# Patient Record
Sex: Male | Born: 1965
Health system: Southern US, Community
[De-identification: ages and names within clinical notes are randomized; demographics above are authoritative.]

## PROBLEM LIST (undated history)

## (undated) DIAGNOSIS — T7840XA Allergy, unspecified, initial encounter: Secondary | ICD-10-CM

## (undated) DIAGNOSIS — I499 Cardiac arrhythmia, unspecified: Secondary | ICD-10-CM

## (undated) DIAGNOSIS — E785 Hyperlipidemia, unspecified: Secondary | ICD-10-CM

## (undated) HISTORY — PX: SKIN LESION EXCISION: SHX2412

## (undated) HISTORY — DX: Cardiac arrhythmia, unspecified: I49.9

## (undated) HISTORY — DX: Allergy, unspecified, initial encounter: T78.40XA

## (undated) HISTORY — DX: Hyperlipidemia, unspecified: E78.5

---

## 1984-02-11 HISTORY — PX: KNEE ARTHROSCOPY: SUR90

## 2003-10-06 ENCOUNTER — Ambulatory Visit: Payer: Self-pay | Admitting: *Deleted

## 2006-02-10 HISTORY — PX: TONGUE BIOPSY: SHX1075

## 2014-06-16 ENCOUNTER — Ambulatory Visit: Payer: Self-pay | Admitting: Cardiology

## 2014-07-14 ENCOUNTER — Encounter: Payer: Self-pay | Admitting: *Deleted

## 2014-07-17 ENCOUNTER — Ambulatory Visit (INDEPENDENT_AMBULATORY_CARE_PROVIDER_SITE_OTHER): Payer: BLUE CROSS/BLUE SHIELD | Admitting: Cardiology

## 2014-07-17 ENCOUNTER — Encounter: Payer: Self-pay | Admitting: Cardiology

## 2014-07-17 ENCOUNTER — Other Ambulatory Visit: Payer: Self-pay

## 2014-07-17 ENCOUNTER — Ambulatory Visit (INDEPENDENT_AMBULATORY_CARE_PROVIDER_SITE_OTHER)
Admission: RE | Admit: 2014-07-17 | Discharge: 2014-07-17 | Disposition: A | Discharge status: Home / Self Care | Payer: BLUE CROSS/BLUE SHIELD | Source: Ambulatory Visit | Attending: Cardiology | Admitting: Cardiology

## 2014-07-17 VITALS — BP 126/82 | HR 60 | Ht 72.0 in

## 2014-07-17 DIAGNOSIS — R002 Palpitations: Secondary | ICD-10-CM

## 2014-07-17 DIAGNOSIS — E785 Hyperlipidemia, unspecified: Secondary | ICD-10-CM | POA: Diagnosis not present

## 2014-07-17 DIAGNOSIS — Z8249 Family history of ischemic heart disease and other diseases of the circulatory system: Secondary | ICD-10-CM | POA: Diagnosis not present

## 2014-07-17 NOTE — Patient Instructions (Addendum)
Medication Instructions:  Your physician recommends that you continue on your current medications as directed. Please refer to the Current Medication list given to you today.  Labwork: NONE  Testing/Procedures: Your physician has requested that you have cardiac CT. Cardiac computed tomography (CT) is a painless test that uses an x-ray machine to take clear, detailed pictures of your heart. For further information please visit [facility]. Please follow instruction sheet as given.  Your physician has recommended that you wear a 24 holter monitor. Holter monitors are medical devices that record the heart's electrical activity. Doctors most often use these monitors to diagnose arrhythmias. Arrhythmias are problems with the speed or rhythm of the heartbeat. The monitor is a small, portable device. You can wear one while you do your normal daily activities. This is usually used to diagnose what is causing palpitations/syncope (passing out).  Follow-Up: Your physician recommends that you schedule a follow-up appointment in: 2 months with Dr. Meda Coffee.   Any Other Special Instructions Will Be Listed Below (If Applicable).

## 2014-07-17 NOTE — Addendum Note (Signed)
Addended by: Ewell Poe L on: 07/17/2014 10:56 AM   Modules accepted: Orders

## 2014-07-17 NOTE — Progress Notes (Signed)
Patient ID: Justin Stanley, male   DOB: August 20, 1965, 49 y.o.   MRN: [id number]      Cardiology Office Note   Date:  07/17/2014   ID:  [name], DOB 03/23/1965, MRN [id number]  PCP:  Dayton Martes, MD  Cardiologist:  Dorothy Spark, MD   Chief complain: Palpitations   History of Present Illness: Justin Stanley is a 49 y.o. male who presents for evaluation of palpitations. He was previously followed by Dr Lia Foyer. He had negative stress test and normal echocardiogram about 10 years ago. 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 No LE edema, orthopnea. He is ok during the day but develops palpitations at night after dinner and they keep him away from sleep. They feel like a strong beat followed by a pause, followed by a strong beat. They feel painful. He has a strong FH of premature CAD, his father and uncle, GF had MI, CABG in their 15'. His LDL has always been high, with excellent diet and plenty of exercise, no smoking or drinking.   Past Medical History  Diagnosis Date  . Arrhythmia     History reviewed. No pertinent past surgical history.   Current Outpatient Prescriptions  Medication Sig Dispense Refill  . tamsulosin (FLOMAX) 0.4 MG CAPS capsule Take 0.4 mg by mouth.     No current facility-administered medications for this visit.    Allergies:   Review of patient's allergies indicates no known allergies.    Social History:  The patient  reports that he has never smoked. He does not have any smokeless tobacco history on file. He reports that he does not drink alcohol or use illicit drugs.   Family History:  The patient's family history includes Acute myelogenous leukemia in his father; Breast cancer in his mother.    ROS:  Please see the history of present illness.   Otherwise, review of systems are positive for none.   All other systems are reviewed and negative.    PHYSICAL EXAM: VS:  BP 126/82 mmHg   Pulse 60  Ht 6' (1.829 m) , BMI There is no weight on file to calculate BMI. GEN: Well nourished, well developed, in no acute distress HEENT: normal Neck: no JVD, carotid bruits, or masses Cardiac: RRR; no murmurs, rubs, or gallops,no edema  Respiratory:  clear to auscultation bilaterally, normal work of breathing GI: soft, nontender, nondistended, + BS MS: no deformity or atrophy Skin: warm and dry, no rash Neuro:  Strength and sensation are intact Psych: euthymic mood, full affect   EKG:  EKG is ordered today. The ekg ordered today demonstrates SR, normal ECG   Recent Labs: No results found for requested labs within last 365 days.    Lipid Panel No results found for: CHOL, TRIG, HDL, CHOLHDL, VLDL, LDLCALC, LDLDIRECT    Wt Readings from Last 3 Encounters:  No data found for Wt      ASSESSMENT AND PLAN:  1.  Palpitations - very symptomatic, we will start 24 hour Holter, it seems that he has symptomatic PVCs. We will request records from his PCP - TSH level.  2. Hyperlidemia with strong FH of premature CAD - LDL 135, HDL 34 - we will order calcium score and reclassify, if abnormal ca score we will start statin, he is agreeable.   Labs/ tests ordered today include:   Orders Placed This Encounter  Procedures  . CT Cardiac Scoring  .  EKG 12-Lead  . Holter monitor - 24 hour   Follow up in 2 months.  Signed, Dorothy Spark, MD  07/17/2014 10:31 AM    [address], [place], [place]  [address] Phone: [phone]; Fax: [phone]

## 2014-07-24 ENCOUNTER — Other Ambulatory Visit (INDEPENDENT_AMBULATORY_CARE_PROVIDER_SITE_OTHER): Payer: BLUE CROSS/BLUE SHIELD | Admitting: *Deleted

## 2014-07-24 ENCOUNTER — Ambulatory Visit (INDEPENDENT_AMBULATORY_CARE_PROVIDER_SITE_OTHER): Payer: BLUE CROSS/BLUE SHIELD

## 2014-07-24 DIAGNOSIS — R002 Palpitations: Secondary | ICD-10-CM

## 2014-07-24 LAB — TSH: TSH: 2.4 u[IU]/mL (ref 0.35–4.50)

## 2014-09-25 ENCOUNTER — Encounter: Payer: Self-pay | Admitting: Cardiology

## 2014-09-25 ENCOUNTER — Ambulatory Visit (INDEPENDENT_AMBULATORY_CARE_PROVIDER_SITE_OTHER): Payer: BLUE CROSS/BLUE SHIELD | Admitting: Cardiology

## 2014-09-25 VITALS — BP 124/76 | HR 75 | Ht 72.0 in | Wt 172.0 lb

## 2014-09-25 DIAGNOSIS — I491 Atrial premature depolarization: Secondary | ICD-10-CM | POA: Diagnosis not present

## 2014-09-25 DIAGNOSIS — E785 Hyperlipidemia, unspecified: Secondary | ICD-10-CM

## 2014-09-25 NOTE — Patient Instructions (Signed)
**Note De-identified Danali Marinos Obfuscation** Medication Instructions:  Same-no change  Labwork: None  Testing/Procedures: None  Follow-Up: Your physician wants you to follow-up in: 1 year. You will receive a reminder letter in the mail two months in advance. If you don't receive a letter, please call our office to schedule the follow-up appointment.      

## 2014-09-25 NOTE — Progress Notes (Signed)
Patient ID: Justin Stanley, male   DOB: 1965-11-18, 49 y.o.   MRN: [id number]      Cardiology Office Note   Date:  09/25/2014   ID:  [name], DOB [birth year], MRN [id number]  PCP:  Dayton Martes, MD  Cardiologist:  Dorothy Spark, MD   Chief complain: Palpitations   History of Present Illness: Justin Stanley is a 49 y.o. male who presents for evaluation of palpitations. He was previously followed by Dr Lia Foyer. He had negative stress test and normal echocardiogram about 10 years ago. 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 No LE edema, orthopnea. He is ok during the day but develops palpitations at night after dinner and they keep him away from sleep. They feel like a strong beat followed by a pause, followed by a strong beat. They feel painful. He has a strong FH of premature CAD, his father and uncle, GF had MI, CABG in their 26'. His LDL has always been high, with excellent diet and plenty of exercise, no smoking or drinking.  09/25/14 - follow up, normal calcium score, Holter monitor showed 1300 PACs, no runs, he continues to have palpitations after dinner, but disapear after he coughs. He doesn't want to take medicines for it.    Past Medical History  Diagnosis Date  . Arrhythmia     No past surgical history on file.   Current Outpatient Prescriptions  Medication Sig Dispense Refill  . tamsulosin (FLOMAX) 0.4 MG CAPS capsule Take 0.4 mg by mouth.     No current facility-administered medications for this visit.    Allergies:   Review of patient's allergies indicates no known allergies.    Social History:  The patient  reports that he has never smoked. He does not have any smokeless tobacco history on file. He reports that he does not drink alcohol or use illicit drugs.   Family History:  The patient's family history includes Acute myelogenous leukemia in his father; Breast cancer in his mother.    ROS:   Please see the history of present illness.   Otherwise, review of systems are positive for none.   All other systems are reviewed and negative.    PHYSICAL EXAM: VS:  BP 124/76 mmHg  Pulse 75  Ht 6' (1.829 m)  Wt 172 lb (78.019 kg)  BMI 23.32 kg/m2  SpO2 96% , BMI Body mass index is 23.32 kg/(m^2). GEN: Well nourished, well developed, in no acute distress HEENT: normal Neck: no JVD, carotid bruits, or masses Cardiac: RRR; no murmurs, rubs, or gallops,no edema  Respiratory:  clear to auscultation bilaterally, normal work of breathing GI: soft, nontender, nondistended, + BS MS: no deformity or atrophy Skin: warm and dry, no rash Neuro:  Strength and sensation are intact Psych: euthymic mood, full affect   EKG:  EKG is ordered today. The ekg ordered today demonstrates SR, normal ECG   Recent Labs: 07/24/2014: TSH 2.40    Lipid Panel No results found for: CHOL, TRIG, HDL, CHOLHDL, VLDL, LDLCALC, LDLDIRECT    Wt Readings from Last 3 Encounters:  09/25/14 172 lb (78.019 kg)    Calcium score 07/17/14 FINDINGS: Non-cardiac: See separate report from [facility].  Ascending Aorta: Normal caliber.  Pericardium: Normal  Coronary arteries: Normal origin.  IMPRESSION: Coronary calcium score of 0. This was 0 percentile for age and sex matched control.  Ena Dawley   ASSESSMENT AND PLAN:  1.  Palpitations - very symptomatic, Normal TSH, PACs - 1300 in 24 hours, suggested Flecainide (bradycardic for BB), but would like to be without meds.  2. Hyperlidemia with strong FH of premature CAD - LDL 135, HDL 34 - calcium score 0, we will start red yeast rice only.  Follow up in 1 year.  Signed, Dorothy Spark, MD  09/25/2014 9:39 AM    [address], [place], [place]  [address] Phone: [phone]; Fax: [phone]

## 2016-07-02 ENCOUNTER — Encounter: Payer: Self-pay | Admitting: Internal Medicine

## 2016-08-25 ENCOUNTER — Ambulatory Visit (AMBULATORY_SURGERY_CENTER): Payer: Self-pay

## 2016-08-25 VITALS — Ht 71.0 in | Wt 168.8 lb

## 2016-08-25 DIAGNOSIS — Z1211 Encounter for screening for malignant neoplasm of colon: Secondary | ICD-10-CM

## 2016-08-25 NOTE — Progress Notes (Signed)
No allergies to eggs or soy No past problems with anesthesia No home oxygen No diet meds  Registered emmi 

## 2016-09-02 ENCOUNTER — Encounter: Payer: Self-pay | Admitting: Internal Medicine

## 2016-09-08 ENCOUNTER — Encounter: Payer: Self-pay | Admitting: Internal Medicine

## 2016-09-08 ENCOUNTER — Ambulatory Visit (AMBULATORY_SURGERY_CENTER): Payer: BLUE CROSS/BLUE SHIELD | Admitting: Internal Medicine

## 2016-09-08 VITALS — BP 101/71 | HR 58 | Temp 96.6°F | Resp 14 | Ht 71.0 in | Wt 168.0 lb

## 2016-09-08 DIAGNOSIS — D122 Benign neoplasm of ascending colon: Secondary | ICD-10-CM

## 2016-09-08 DIAGNOSIS — D123 Benign neoplasm of transverse colon: Secondary | ICD-10-CM

## 2016-09-08 DIAGNOSIS — Z1212 Encounter for screening for malignant neoplasm of rectum: Secondary | ICD-10-CM

## 2016-09-08 DIAGNOSIS — Z1211 Encounter for screening for malignant neoplasm of colon: Secondary | ICD-10-CM

## 2016-09-08 HISTORY — PX: COLONOSCOPY: SHX174

## 2016-09-08 MED ORDER — SODIUM CHLORIDE 0.9 % IV SOLN: 500.0000 mL | INTRAVENOUS | Status: AC

## 2016-09-08 NOTE — Progress Notes (Signed)
To recovery, report to [place], RN, VSS 

## 2016-09-08 NOTE — Progress Notes (Signed)
Pt's states no medical or surgical changes since previsit or office visit.  No egg or soy allergy  

## 2016-09-08 NOTE — Op Note (Signed)
[facility] Patient Name: Justin Stanley Procedure Date: 09/08/2016 8:43 AM MRN: [id number] Endoscopist: Jerene Bears , MD Age: 51 Referring MD:  Date of Birth: 28-Dec-1965 Gender: Male Account #: [id number] Procedure:                Colonoscopy Indications:              Screening for colorectal malignant neoplasm, This                            is the patient's first colonoscopy Medicines:                Monitored Anesthesia Care Procedure:                Pre-Anesthesia Assessment:                           - Prior to the procedure, a History and Physical                            was performed, and patient medications and                            allergies were reviewed. The patient's tolerance of                            previous anesthesia was also reviewed. The risks                            and benefits of the procedure and the sedation                            options and risks were discussed with the patient.                            All questions were answered, and informed consent                            was obtained. Prior Anticoagulants: The patient has                            taken no previous anticoagulant or antiplatelet                            agents. ASA Grade Assessment: II - A patient with                            mild systemic disease. After reviewing the risks                            and benefits, the patient was deemed in                            satisfactory condition to undergo the procedure.  After obtaining informed consent, the colonoscope                            was passed under direct vision. Throughout the                            procedure, the patient's blood pressure, pulse, and                            oxygen saturations were monitored continuously. The                            Colonoscope was introduced through the anus and                            advanced to the the cecum,  identified by                            appendiceal orifice and ileocecal valve. The                            colonoscopy was performed without difficulty. The                            patient tolerated the procedure well. The quality                            of the bowel preparation was good. The ileocecal                            valve, appendiceal orifice, and rectum were                            photographed. Scope In: 8:51:54 AM Scope Out: 9:09:14 AM Scope Withdrawal Time: 0 hours 13 minutes 54 seconds  Total Procedure Duration: 0 hours 17 minutes 20 seconds  Findings:                 The digital rectal exam was normal.                           Two sessile polyps were found in the ascending                            colon. The polyps were 6 to 10 mm in size. These                            polyps were removed with a cold snare. Resection                            and retrieval were complete.                           A 6 mm polyp was found in the hepatic flexure. The  polyp was sessile. The polyp was removed with a                            cold snare. Resection and retrieval were complete.                           Multiple small-mouthed diverticula were found in                            the descending colon.                           The retroflexed view of the distal rectum and anal                            verge was normal and showed no anal or rectal                            abnormalities. Complications:            No immediate complications. Estimated Blood Loss:     Estimated blood loss was minimal. Impression:               - Two 6 to 10 mm polyps in the ascending colon,                            removed with a cold snare. Resected and retrieved.                           - One 6 mm polyp at the hepatic flexure, removed                            with a cold snare. Resected and retrieved.                           - Mild  diverticulosis in the descending colon. Recommendation:           - Patient has a contact number available for                            emergencies. The signs and symptoms of potential                            delayed complications were discussed with the                            patient. Return to normal activities tomorrow.                            Written discharge instructions were provided to the                            patient.                           -  Resume previous diet.                           - Continue present medications.                           - Await pathology results.                           - Repeat colonoscopy is recommended. The                            colonoscopy date will be determined after pathology                            results from today's exam become available for                            review. Jerene Bears, MD 09/08/2016 9:13:54 AM This report has been signed electronically.

## 2016-09-08 NOTE — Patient Instructions (Signed)
Impressions/recommendations:  Polyps (handout given) Diverticulosis (handout given) High Fiber Diet (handout given)  YOU HAD AN ENDOSCOPIC PROCEDURE TODAY AT [facility]:   Refer to the procedure report that was given to you for any specific questions about what was found during the examination.  If the procedure report does not answer your questions, please call your gastroenterologist to clarify.  If you requested that your care partner not be given the details of your procedure findings, then the procedure report has been included in a sealed envelope for you to review at your convenience later.  YOU SHOULD EXPECT: Some feelings of bloating in the abdomen. Passage of more gas than usual.  Walking can help get rid of the air that was put into your GI tract during the procedure and reduce the bloating. If you had a lower endoscopy (such as a colonoscopy or flexible sigmoidoscopy) you may notice spotting of blood in your stool or on the toilet paper. If you underwent a bowel prep for your procedure, you may not have a normal bowel movement for a few days.  Please Note:  You might notice some irritation and congestion in your nose or some drainage.  This is from the oxygen used during your procedure.  There is no need for concern and it should clear up in a day or so.  SYMPTOMS TO REPORT IMMEDIATELY:   Following lower endoscopy (colonoscopy or flexible sigmoidoscopy):  Excessive amounts of blood in the stool  Significant tenderness or worsening of abdominal pains  Swelling of the abdomen that is new, acute  Fever of 100F or higher   For urgent or emergent issues, a gastroenterologist can be reached at any hour by calling [phone].   DIET:  We do recommend a small meal at first, but then you may proceed to your regular diet.  Drink plenty of fluids but you should avoid alcoholic beverages for 24 hours.  ACTIVITY:  You should plan to take it easy for the rest of today  and you should NOT DRIVE or use heavy machinery until tomorrow (because of the sedation medicines used during the test).    FOLLOW UP: Our staff will call the number listed on your records the next business day following your procedure to check on you and address any questions or concerns that you may have regarding the information given to you following your procedure. If we do not reach you, we will leave a message.  However, if you are feeling well and you are not experiencing any problems, there is no need to return our call.  We will assume that you have returned to your regular daily activities without incident.  If any biopsies were taken you will be contacted by phone or by letter within the next 1-3 weeks.  Please call us at [phone] if you have not heard about the biopsies in 3 weeks.    SIGNATURES/CONFIDENTIALITY: You and/or your care partner have signed paperwork which will be entered into your electronic medical record.  These signatures attest to the fact that that the information above on your After Visit Summary has been reviewed and is understood.  Full responsibility of the confidentiality of this discharge information lies with you and/or your care-partner.

## 2016-09-08 NOTE — Progress Notes (Signed)
Called to room to assist during endoscopic procedure.  Patient ID and intended procedure confirmed with present staff. Received instructions for my participation in the procedure from the performing physician.  

## 2016-09-09 ENCOUNTER — Telehealth: Payer: Self-pay | Admitting: *Deleted

## 2016-09-09 NOTE — Telephone Encounter (Signed)
  Follow up Call-  Call back number 09/08/2016  Post procedure Call Back phone  # [phone]  Permission to leave phone message Yes  Some recent data might be hidden     Patient questions:  Do you have a fever, pain , or abdominal swelling? No. Pain Score  0 *  Have you tolerated food without any problems? Yes.    Have you been able to return to your normal activities? Yes.    Do you have any questions about your discharge instructions: Diet   No. Medications  No. Follow up visit  No.  Do you have questions or concerns about your Care? No.  Actions: * If pain score is 4 or above: No action needed, pain <4.

## 2016-09-11 ENCOUNTER — Encounter: Payer: Self-pay | Admitting: Internal Medicine

## 2016-12-17 DIAGNOSIS — Z136 Encounter for screening for cardiovascular disorders: Secondary | ICD-10-CM | POA: Diagnosis not present

## 2016-12-17 DIAGNOSIS — E7801 Familial hypercholesterolemia: Secondary | ICD-10-CM | POA: Diagnosis not present

## 2016-12-17 DIAGNOSIS — Z713 Dietary counseling and surveillance: Secondary | ICD-10-CM | POA: Diagnosis not present

## 2016-12-17 DIAGNOSIS — Z13220 Encounter for screening for lipoid disorders: Secondary | ICD-10-CM | POA: Diagnosis not present

## 2016-12-17 DIAGNOSIS — Z131 Encounter for screening for diabetes mellitus: Secondary | ICD-10-CM | POA: Diagnosis not present

## 2017-02-17 DIAGNOSIS — D1801 Hemangioma of skin and subcutaneous tissue: Secondary | ICD-10-CM | POA: Diagnosis not present

## 2017-02-17 DIAGNOSIS — Z85828 Personal history of other malignant neoplasm of skin: Secondary | ICD-10-CM | POA: Diagnosis not present

## 2017-02-17 DIAGNOSIS — L570 Actinic keratosis: Secondary | ICD-10-CM | POA: Diagnosis not present

## 2017-02-17 DIAGNOSIS — L814 Other melanin hyperpigmentation: Secondary | ICD-10-CM | POA: Diagnosis not present

## 2017-02-17 DIAGNOSIS — L821 Other seborrheic keratosis: Secondary | ICD-10-CM | POA: Diagnosis not present

## 2017-03-16 DIAGNOSIS — R3915 Urgency of urination: Secondary | ICD-10-CM | POA: Diagnosis not present

## 2017-03-16 DIAGNOSIS — N401 Enlarged prostate with lower urinary tract symptoms: Secondary | ICD-10-CM | POA: Diagnosis not present

## 2017-12-02 DIAGNOSIS — Z13220 Encounter for screening for lipoid disorders: Secondary | ICD-10-CM | POA: Diagnosis not present

## 2017-12-02 DIAGNOSIS — Z6822 Body mass index (BMI) 22.0-22.9, adult: Secondary | ICD-10-CM | POA: Diagnosis not present

## 2017-12-02 DIAGNOSIS — Z136 Encounter for screening for cardiovascular disorders: Secondary | ICD-10-CM | POA: Diagnosis not present

## 2017-12-02 DIAGNOSIS — Z131 Encounter for screening for diabetes mellitus: Secondary | ICD-10-CM | POA: Diagnosis not present

## 2017-12-23 ENCOUNTER — Encounter: Payer: Self-pay | Admitting: Cardiology

## 2018-01-22 ENCOUNTER — Encounter: Payer: Self-pay | Admitting: Cardiology

## 2018-01-22 ENCOUNTER — Ambulatory Visit (INDEPENDENT_AMBULATORY_CARE_PROVIDER_SITE_OTHER)
Admission: RE | Admit: 2018-01-22 | Discharge: 2018-01-22 | Disposition: A | Discharge status: Home / Self Care | Payer: Self-pay | Source: Ambulatory Visit | Attending: Cardiology | Admitting: Cardiology

## 2018-01-22 ENCOUNTER — Ambulatory Visit (INDEPENDENT_AMBULATORY_CARE_PROVIDER_SITE_OTHER): Payer: BLUE CROSS/BLUE SHIELD | Admitting: Cardiology

## 2018-01-22 VITALS — BP 124/76 | HR 69 | Ht 71.0 in | Wt 169.0 lb

## 2018-01-22 DIAGNOSIS — Z8249 Family history of ischemic heart disease and other diseases of the circulatory system: Secondary | ICD-10-CM

## 2018-01-22 DIAGNOSIS — E785 Hyperlipidemia, unspecified: Secondary | ICD-10-CM | POA: Diagnosis not present

## 2018-01-22 NOTE — Patient Instructions (Signed)
Medication Instructions:  Your physician recommends that you continue on your current medications as directed. Please refer to the Current Medication list given to you today.  If you need a refill on your cardiac medications before your next appointment, please call your pharmacy.   Lab work: TODAY: CBC, BMET, TSH, LFTS, NMR Lipid profile  If you have labs (blood work) drawn today and your tests are completely normal, you will receive your results only by: Marland Kitchen MyChart Message (if you have MyChart) OR . A paper copy in the mail If you have any lab test that is abnormal or we need to change your treatment, we will call you to review the results.  Testing/Procedures: Your physician recommends that you have Cardiac Calcium Score CT today   Follow-Up: At [facility], you and your health needs are our priority.  As part of our continuing mission to provide you with exceptional heart care, we have created designated Provider Care Teams.  These Care Teams include your primary Cardiologist (physician) and Advanced Practice Providers (APPs -  Physician Assistants and Nurse Practitioners) who all work together to provide you with the care you need, when you need it. . You will need a follow up appointment in 1 year.  Please call our office 2 months in advance to schedule this appointment.  You may see Ena Dawley, MD or one of the following Advanced Practice Providers on your designated Care Team:   . Lyda Jester, PA-C . Dayna Dunn, PA-C . Ermalinda Barrios, PA-C  Any Other Special Instructions Will Be Listed Below (If Applicable).

## 2018-01-22 NOTE — Progress Notes (Signed)
Cardiology Office Note:    Date:  01/25/2018   ID:  Justin Stanley, DOB 05/02/1965, MRN [id number]  PCP:  Justin Mull, MD  Cardiologist:  Justin Dawley, MD  Electrophysiologist:  None   Referring MD: Justin Mull, MD   Reason for a visit: preventive visit for FH of early CAD  History of Present Illness:    Justin Stanley is a 52 y.o. male with a hx of FH of early CAF, HLP, and palpitations. Last seen in 09/2014 for palpitations. He was previously followed by Dr Justin Stanley. He had negative stress test and normal echocardiogram about 10 years ago. 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 No LE edema, orthopnea. He is ok during the day but develops palpitations at night after dinner and they keep him away from sleep. They feel like a strong beat followed by a pause, followed by a strong beat. They feel painful. He has a strong FH of premature CAD, his father and uncle, GF had MI, CABG in their 24'. His LDL has always been high, with excellent diet and plenty of exercise, no smoking or drinking.  09/25/14 - follow up, normal calcium score, Holter monitor showed 1300 PACs, no runs, he continues to have palpitations after dinner, but disapear after he coughs. He doesn't want to take medicines for it.   01/22/2018 - 3 years follow up, the patient continues to work and remains active lifestyle, healthy diet, he denies any chest pain or SOB, no Le edema, claudications. He is now considering taking statins if necessary. Occassional palpitations, not associated with dizziness or syncope.   Past Medical History:  Diagnosis Date  . Allergy    cats and ragweed  . Arrhythmia   . Hyperlipidemia     Past Surgical History:  Procedure Laterality Date  . KNEE ARTHROSCOPY  1986   right  . SKIN LESION EXCISION  2016, 2018  . TONGUE BIOPSY  2008   dentist    Current Medications: Current Meds  Medication Sig  . cetirizine (ZYRTEC) 10 MG tablet  Take 10 mg by mouth daily.  . Multiple Vitamin (MULTIVITAMIN) tablet Take 1 tablet by mouth daily. CENTRUM SILVER  . tamsulosin (FLOMAX) 0.4 MG CAPS capsule Take 0.4 mg by mouth.     Allergies:   Patient has no known allergies.   Social History   Socioeconomic History  . Marital status: Single    Spouse name: Not on file  . Number of children: Not on file  . Years of education: Not on file  . Highest education level: Not on file  Occupational History  . Not on file  Social Needs  . Financial resource strain: Not on file  . Food insecurity:    Worry: Not on file    Inability: Not on file  . Transportation needs:    Medical: Not on file    Non-medical: Not on file  Tobacco Use  . Smoking status: Never Smoker  . Smokeless tobacco: Never Used  Substance and Sexual Activity  . Alcohol use: No    Alcohol/week: 0.0 standard drinks  . Drug use: No  . Sexual activity: Not on file  Lifestyle  . Physical activity:    Days per week: Not on file    Minutes per session: Not on file  . Stress: Not on file  Relationships  . Social connections:    Talks on phone: Not on file    Gets  together: Not on file    Attends religious service: Not on file    Active member of club or organization: Not on file    Attends meetings of clubs or organizations: Not on file    Relationship status: Not on file  Other Topics Concern  . Not on file  Social History Narrative  . Not on file     Family History: The patient'sfamily history includes Acute myelogenous leukemia in his father; Breast cancer in his mother. There is no history of Colon cancer.  ROS:   Please see the history of present illness.    All other systems reviewed and are negative.  EKGs/Labs/Other Studies Reviewed:    The following studies were reviewed today:  EKG:  EKG is ordered today.  The ekg ordered today demonstrates NSR, LAD, unchanged from prior, personally reviewed.  Recent Labs: 01/22/2018: ALT 21; BUN 11;  Creatinine, Ser 0.85; Hemoglobin 16.2; Platelets 204; Potassium 3.8; Sodium 140; TSH 2.550  Recent Lipid Panel No results found for: CHOL, TRIG, HDL, CHOLHDL, VLDL, LDLCALC, LDLDIRECT  Physical Exam:    VS:  BP 124/76   Pulse 69   Ht 5\' 11"  (1.803 m)   Wt 169 lb (76.7 kg)   SpO2 98%   BMI 23.57 kg/m     Wt Readings from Last 3 Encounters:  01/22/18 169 lb (76.7 kg)  09/08/16 168 lb (76.2 kg)  08/25/16 168 lb 12.8 oz (76.6 kg)     GEN: Well nourished, well developed in no acute distress HEENT: Normal NECK: No JVD; No carotid bruits LYMPHATICS: No lymphadenopathy CARDIAC: RRR, no murmurs, rubs, gallops RESPIRATORY:  Clear to auscultation without rales, wheezing or rhonchi  ABDOMEN: Soft, non-tender, non-distended MUSCULOSKELETAL:  No edema; No deformity  SKIN: Warm and dry NEUROLOGIC:  Alert and oriented x 3 PSYCHIATRIC:  Normal affect   ASSESSMENT:    1. Family history of early CAD   2. Hyperlipidemia, unspecified hyperlipidemia type    PLAN:    In order of problems listed above:  1. Repeat calcium scoring today showed ca score of 0. 2. NMR lipids showed LDL particles of 1900, with strong FH of early CAD we will start rosuvastatin 5 mg po daily and recheck NMR lipids and CMP in 1 month.    Medication Adjustments/Labs and Tests Ordered: Current medicines are reviewed at length with the patient today.  Concerns regarding medicines are outlined above.  Orders Placed This Encounter  Procedures  . CT CARDIAC SCORING  . Basic metabolic panel  . CBC  . TSH  . Hepatic function panel  . NMR, lipoprofile  . Lipoprotein A (LPA)  . Apolipoprotein B  . EKG 12-Lead   No orders of the defined types were placed in this encounter.   Patient Instructions  Medication Instructions:  Your physician recommends that you continue on your current medications as directed. Please refer to the Current Medication list given to you today.  If you need a refill on your cardiac  medications before your next appointment, please call your pharmacy.   Lab work: TODAY: CBC, BMET, TSH, LFTS, NMR Lipid profile  If you have labs (blood work) drawn today and your tests are completely normal, you will receive your results only by: Marland Kitchen MyChart Message (if you have MyChart) OR . A paper copy in the mail If you have any lab test that is abnormal or we need to change your treatment, we will call you to review the results.  Testing/Procedures: Your physician  recommends that you have Cardiac Calcium Score CT today   Follow-Up: At [facility], you and your health needs are our priority.  As part of our continuing mission to provide you with exceptional heart care, we have created designated Provider Care Teams.  These Care Teams include your primary Cardiologist (physician) and Advanced Practice Providers (APPs -  Physician Assistants and Nurse Practitioners) who all work together to provide you with the care you need, when you need it. . You will need a follow up appointment in 1 year.  Please call our office 2 months in advance to schedule this appointment.  You may see Justin Dawley, MD or one of the following Advanced Practice Providers on your designated Care Team:   . Lyda Jester, PA-C . Dayna Dunn, PA-C . Ermalinda Barrios, PA-C  Any Other Special Instructions Will Be Listed Below (If Applicable).       Signed, Justin Dawley, MD  01/25/2018 11:15 PM    [place]

## 2018-01-24 LAB — CBC
Hematocrit: 45.8 % (ref 37.5–51.0)
Hemoglobin: 16.2 g/dL (ref 13.0–17.7)
MCH: 31.1 pg (ref 26.6–33.0)
MCHC: 35.4 g/dL (ref 31.5–35.7)
MCV: 88 fL (ref 79–97)
Platelets: 204 10*3/uL (ref 150–450)
RBC: 5.21 x10E6/uL (ref 4.14–5.80)
RDW: 12.6 % (ref 12.3–15.4)
WBC: 6.6 10*3/uL (ref 3.4–10.8)

## 2018-01-24 LAB — NMR, LIPOPROFILE
Cholesterol, Total: 214 mg/dL — ABNORMAL HIGH (ref 100–199)
HDL Particle Number: 24.7 umol/L — ABNORMAL LOW (ref >=30.5)
HDL-C: 33 mg/dL — ABNORMAL LOW (ref >39)
LDL Particle Number: 1983 nmol/L — ABNORMAL HIGH (ref <1000)
LDL Size: 19.9 nm — ABNORMAL LOW (ref >20.5)
LDL-C: 142 mg/dL — ABNORMAL HIGH (ref 0–99)
LP-IR Score: 83 — ABNORMAL HIGH (ref <=45)
Small LDL Particle Number: 1446 nmol/L — ABNORMAL HIGH (ref <=527)
Triglycerides: 196 mg/dL — ABNORMAL HIGH (ref 0–149)

## 2018-01-24 LAB — HEPATIC FUNCTION PANEL
ALT: 21 IU/L (ref 0–44)
AST: 24 IU/L (ref 0–40)
Albumin: 4.8 g/dL (ref 3.5–5.5)
Alkaline Phosphatase: 76 IU/L (ref 39–117)
Bilirubin Total: 0.6 mg/dL (ref 0.0–1.2)
Bilirubin, Direct: 0.15 mg/dL (ref 0.00–0.40)
Total Protein: 7.2 g/dL (ref 6.0–8.5)

## 2018-01-24 LAB — BASIC METABOLIC PANEL
BUN/Creatinine Ratio: 13 (ref 9–20)
BUN: 11 mg/dL (ref 6–24)
CO2: 27 mmol/L (ref 20–29)
Calcium: 9.9 mg/dL (ref 8.7–10.2)
Chloride: 99 mmol/L (ref 96–106)
Creatinine, Ser: 0.85 mg/dL (ref 0.76–1.27)
GFR calc Af Amer: 114 mL/min/{1.73_m2} (ref >59)
GFR calc non Af Amer: 99 mL/min/{1.73_m2} (ref >59)
Glucose: 68 mg/dL (ref 65–99)
Potassium: 3.8 mmol/L (ref 3.5–5.2)
Sodium: 140 mmol/L (ref 134–144)

## 2018-01-24 LAB — LIPOPROTEIN A (LPA): Lipoprotein (a): 203.1 nmol/L — ABNORMAL HIGH (ref <75.0)

## 2018-01-24 LAB — APOLIPOPROTEIN B: Apolipoprotein B: 146 mg/dL — ABNORMAL HIGH (ref <90)

## 2018-01-24 LAB — TSH: TSH: 2.55 u[IU]/mL (ref 0.450–4.500)

## 2018-01-25 ENCOUNTER — Telehealth: Payer: Self-pay | Admitting: Cardiology

## 2018-01-25 DIAGNOSIS — Z8249 Family history of ischemic heart disease and other diseases of the circulatory system: Secondary | ICD-10-CM

## 2018-01-25 DIAGNOSIS — E785 Hyperlipidemia, unspecified: Secondary | ICD-10-CM

## 2018-01-25 MED ORDER — ROSUVASTATIN CALCIUM 5 MG PO TABS: 5.0000 mg | ORAL_TABLET | Freq: Every day | ORAL | 1 refills | Status: DC

## 2018-01-25 NOTE — Telephone Encounter (Signed)
Pt is calling back with his lab results from 12/13, when he saw Dr Meda Coffee in the office.  Pt aware that this is in Dr Francesca Oman in-basket for further review, and once she results this I will call him back shortly thereafter.  Pt verbalized understanding and agrees with this plan.

## 2018-01-25 NOTE — Telephone Encounter (Signed)
-----   Message from Dorothy Spark, MD sent at 01/25/2018  5:10 PM EST ----- Justin Stanley, please start him on 5 mg po daily and recheck NMR lipid panel and CMP in 1 month ----- Message ----- From: Leeroy Bock, [facility] Sent: 01/25/2018   3:24 PM EST To: Dorothy Spark, MD, Erskine Emery, [facility]  I'd target LDL goal < 100 with his strong family history of CAD and elevated LDL particle # compared to his LDL, even with normal calcium score. He should be ok with lower dose of rosuvastatin 10mg  daily, but I'd continue to check an advanced lipid panel when he has f/u labs drawn to target LDL particle # < 1000.  Thanks, Barista ----- Message ----- From: Dorothy Spark, MD Sent: 01/25/2018   3:00 PM EST To: Nuala Alpha, LPN, Erskine Emery, [facility], #  Jinny Blossom and Georgina Peer, This patient has very strong FH of CAD, his calcium score is 0, lipids numbers are above, would you do anything? He is active, fit, no other risk factors. [name]

## 2018-01-25 NOTE — Telephone Encounter (Signed)
Notified the pt that per Dr Meda Coffee and our Pharmacist in [place], based on his labs, family history of CAD, and calcium score, they recommend that he start taking rosuvastatin 5 mg po daily at bedtime, and recheck an advanced lipid panel and cmet in month.  Confirmed the pharmacy of choice with the pt. Scheduled the pts lab appt for one month out of 02/25/2018.  Pt is aware to come fasting.  Pt verbalized understanding and agrees with this plan.

## 2018-01-25 NOTE — Telephone Encounter (Signed)
  Error, no note needed

## 2018-01-25 NOTE — Telephone Encounter (Signed)
  Patient is returning call regarding test results

## 2018-02-25 ENCOUNTER — Other Ambulatory Visit: Payer: BLUE CROSS/BLUE SHIELD | Admitting: *Deleted

## 2018-02-25 DIAGNOSIS — E785 Hyperlipidemia, unspecified: Secondary | ICD-10-CM | POA: Diagnosis not present

## 2018-02-25 DIAGNOSIS — Z8249 Family history of ischemic heart disease and other diseases of the circulatory system: Secondary | ICD-10-CM

## 2018-02-26 LAB — NMR, LIPOPROFILE
Cholesterol, Total: 158 mg/dL (ref 100–199)
HDL Particle Number: 25.9 umol/L — ABNORMAL LOW (ref >=30.5)
HDL-C: 34 mg/dL — ABNORMAL LOW (ref >39)
LDL Particle Number: 1544 nmol/L — ABNORMAL HIGH (ref <1000)
LDL Size: 19.9 nm — ABNORMAL LOW (ref >20.5)
LDL-C: 106 mg/dL — ABNORMAL HIGH (ref 0–99)
LP-IR Score: 70 — ABNORMAL HIGH (ref <=45)
Small LDL Particle Number: 1100 nmol/L — ABNORMAL HIGH (ref <=527)
Triglycerides: 92 mg/dL (ref 0–149)

## 2018-02-26 LAB — COMPREHENSIVE METABOLIC PANEL
ALT: 22 IU/L (ref 0–44)
AST: 22 IU/L (ref 0–40)
Albumin/Globulin Ratio: 2 (ref 1.2–2.2)
Albumin: 4.3 g/dL (ref 3.5–5.5)
Alkaline Phosphatase: 61 IU/L (ref 39–117)
BUN/Creatinine Ratio: 14 (ref 9–20)
BUN: 13 mg/dL (ref 6–24)
Bilirubin Total: 0.6 mg/dL (ref 0.0–1.2)
CO2: 22 mmol/L (ref 20–29)
Calcium: 9.2 mg/dL (ref 8.7–10.2)
Chloride: 101 mmol/L (ref 96–106)
Creatinine, Ser: 0.95 mg/dL (ref 0.76–1.27)
GFR calc Af Amer: 104 mL/min/{1.73_m2} (ref >59)
GFR calc non Af Amer: 90 mL/min/{1.73_m2} (ref >59)
Globulin, Total: 2.2 g/dL (ref 1.5–4.5)
Glucose: 94 mg/dL (ref 65–99)
Potassium: 4.3 mmol/L (ref 3.5–5.2)
Sodium: 139 mmol/L (ref 134–144)
Total Protein: 6.5 g/dL (ref 6.0–8.5)

## 2018-02-26 LAB — LIPOPROTEIN A (LPA): Lipoprotein (a): 176.7 nmol/L — ABNORMAL HIGH (ref <75.0)

## 2018-02-26 LAB — APOLIPOPROTEIN B: Apolipoprotein B: 107 mg/dL — ABNORMAL HIGH (ref <90)

## 2018-03-01 ENCOUNTER — Telehealth: Payer: Self-pay | Admitting: *Deleted

## 2018-03-01 MED ORDER — ROSUVASTATIN CALCIUM 10 MG PO TABS: 10.0000 mg | ORAL_TABLET | Freq: Every day | ORAL | 2 refills | Status: DC

## 2018-03-01 NOTE — Telephone Encounter (Signed)
Spoke with the pt and informed him of his lab results and  recommendations per Dr Meda Coffee, for him to increase his Rosuvastatin to 10 mg po daily. Confirmed the pharmacy of choice with the pt.  Pt verbalized understanding and agrees with this plan.

## 2018-03-01 NOTE — Telephone Encounter (Signed)
-----   Message from Dorothy Spark, MD sent at 02/27/2018  2:06 PM EST ----- Normal LFTs, lipids significantly improved but still elevated, I would increase rosuvastatin to 10 mg daily if he can tolerate it.

## 2018-03-08 ENCOUNTER — Other Ambulatory Visit: Payer: Self-pay | Admitting: Cardiology

## 2018-03-08 MED ORDER — ROSUVASTATIN CALCIUM 10 MG PO TABS: 10.0000 mg | ORAL_TABLET | Freq: Every day | ORAL | 3 refills | Status: DC

## 2018-03-31 DIAGNOSIS — R3915 Urgency of urination: Secondary | ICD-10-CM | POA: Diagnosis not present

## 2018-03-31 DIAGNOSIS — N401 Enlarged prostate with lower urinary tract symptoms: Secondary | ICD-10-CM | POA: Diagnosis not present

## 2018-04-05 DIAGNOSIS — L814 Other melanin hyperpigmentation: Secondary | ICD-10-CM | POA: Diagnosis not present

## 2018-04-05 DIAGNOSIS — Z86018 Personal history of other benign neoplasm: Secondary | ICD-10-CM | POA: Diagnosis not present

## 2018-04-05 DIAGNOSIS — L821 Other seborrheic keratosis: Secondary | ICD-10-CM | POA: Diagnosis not present

## 2018-04-05 DIAGNOSIS — Z23 Encounter for immunization: Secondary | ICD-10-CM | POA: Diagnosis not present

## 2018-04-05 DIAGNOSIS — D225 Melanocytic nevi of trunk: Secondary | ICD-10-CM | POA: Diagnosis not present

## 2018-04-05 DIAGNOSIS — L570 Actinic keratosis: Secondary | ICD-10-CM | POA: Diagnosis not present

## 2019-02-18 ENCOUNTER — Other Ambulatory Visit: Payer: Self-pay | Admitting: Cardiology

## 2019-03-28 DIAGNOSIS — N401 Enlarged prostate with lower urinary tract symptoms: Secondary | ICD-10-CM | POA: Diagnosis not present

## 2019-03-28 DIAGNOSIS — R3915 Urgency of urination: Secondary | ICD-10-CM | POA: Diagnosis not present

## 2019-04-11 ENCOUNTER — Other Ambulatory Visit: Payer: Self-pay | Admitting: Cardiology

## 2019-04-18 DIAGNOSIS — D485 Neoplasm of uncertain behavior of skin: Secondary | ICD-10-CM | POA: Diagnosis not present

## 2019-04-18 DIAGNOSIS — Z85828 Personal history of other malignant neoplasm of skin: Secondary | ICD-10-CM | POA: Diagnosis not present

## 2019-04-18 DIAGNOSIS — Z86018 Personal history of other benign neoplasm: Secondary | ICD-10-CM | POA: Diagnosis not present

## 2019-04-18 DIAGNOSIS — L570 Actinic keratosis: Secondary | ICD-10-CM | POA: Diagnosis not present

## 2019-04-18 DIAGNOSIS — D225 Melanocytic nevi of trunk: Secondary | ICD-10-CM | POA: Diagnosis not present

## 2019-04-18 DIAGNOSIS — L578 Other skin changes due to chronic exposure to nonionizing radiation: Secondary | ICD-10-CM | POA: Diagnosis not present

## 2019-04-18 DIAGNOSIS — D2239 Melanocytic nevi of other parts of face: Secondary | ICD-10-CM | POA: Diagnosis not present

## 2019-04-27 NOTE — Progress Notes (Signed)
Cardiology Office Note    Date:  05/03/2019   ID:  Justin Stanley, DOB 1965/05/22, MRN [id number]  PCP:  Berle Mull, MD  Cardiologist: Ena Dawley, MD EPS: None  No chief complaint on file.   History of Present Illness:  Justin Stanley is a 54 y.o. male with strong family history of early CAD, hyperlipidemia and palpitations.  He has had normal calcium score and Holter monitor showing 1300 PACs no runs.  Does not take medications for it.  Patient last saw Dr. Meda Coffee 01/22/2018 at which time he was doing well and repeat calcium score that day was 0.  LDL particles were 1900 so he was started on rosuvastatin which was increased 02/2018 LDL particle 1544.  Patient comes in for f/u. He denies chest pain, dyspnea, dyspnea on exertion, dizziness and presyncope. Still jogs, walks, weight lifting without symptoms. Had lipids checked last summer for insurance screening. Sept 2020 chol 131, HDL 26, ratio 5.0, LDL 47, triglycerides 135.  Past Medical History:  Diagnosis Date  . Allergy    cats and ragweed  . Arrhythmia   . Hyperlipidemia     Past Surgical History:  Procedure Laterality Date  . KNEE ARTHROSCOPY  1986   right  . SKIN LESION EXCISION  2016, 2018  . TONGUE BIOPSY  2008   dentist    Current Medications: Current Meds  Medication Sig  . cetirizine (ZYRTEC) 10 MG tablet Take 10 mg by mouth daily.  . Multiple Vitamin (MULTIVITAMIN) tablet Take 1 tablet by mouth daily. CENTRUM SILVER  . rosuvastatin (CRESTOR) 10 MG tablet TAKE 1 TABLET DAILY  . tamsulosin (FLOMAX) 0.4 MG CAPS capsule Take 0.4 mg by mouth.  . [DISCONTINUED] rosuvastatin (CRESTOR) 10 MG tablet TAKE 1 TABLET DAILY (PLEASE MAKE APPOINTMENT FOR REFILLS)     Allergies:   Patient has no known allergies.   Social History   Socioeconomic History  . Marital status: Single    Spouse name: Not on file  . Number of children: Not on file  . Years of education: Not on file  . Highest education level: Not  on file  Occupational History  . Not on file  Tobacco Use  . Smoking status: Never Smoker  . Smokeless tobacco: Never Used  Substance and Sexual Activity  . Alcohol use: No    Alcohol/week: 0.0 standard drinks  . Drug use: No  . Sexual activity: Not on file  Other Topics Concern  . Not on file  Social History Narrative  . Not on file   Social Determinants of Health   Financial Resource Strain:   . Difficulty of Paying Living Expenses:   Food Insecurity:   . Worried About Charity fundraiser in the Last Year:   . Arboriculturist in the Last Year:   Transportation Needs:   . Film/video editor (Medical):   Marland Kitchen Lack of Transportation (Non-Medical):   Physical Activity:   . Days of Exercise per Week:   . Minutes of Exercise per Session:   Stress:   . Feeling of Stress :   Social Connections:   . Frequency of Communication with Friends and Family:   . Frequency of Social Gatherings with Friends and Family:   . Attends Religious Services:   . Active Member of Clubs or Organizations:   . Attends Archivist Meetings:   Marland Kitchen Marital Status:      Family History:  The patient's   family history includes Acute myelogenous  leukemia in his father; Breast cancer in his mother.   ROS:   Please see the history of present illness.    ROS All other systems reviewed and are negative.   PHYSICAL EXAM:   VS:  BP 118/76   Pulse 70   Ht 6' (1.829 m)   Wt 172 lb (78 kg)   SpO2 98%   BMI 23.33 kg/m   Physical Exam  GEN: Well nourished, well developed, in no acute distress  Neck: no JVD, carotid bruits, or masses Cardiac:RRR; no murmurs, rubs, or gallops  Respiratory:  clear to auscultation bilaterally, normal work of breathing GI: soft, nontender, nondistended, + BS Ext: without cyanosis, clubbing, or edema, Good distal pulses bilaterally Neuro:  Alert and Oriented x 3 Psych: euthymic mood, full affect  Wt Readings from Last 3 Encounters:  05/03/19 172 lb (78 kg)    01/22/18 169 lb (76.7 kg)  09/08/16 168 lb (76.2 kg)      Studies/Labs Reviewed:   EKG:  EKG is ordered today.  The ekg ordered today demonstrates NSR, normal EKG  Recent Labs: No results found for requested labs within last 8760 hours.   Lipid Panel No results found for: CHOL, TRIG, HDL, CHOLHDL, VLDL, LDLCALC, LDLDIRECT  Additional studies/ records that were reviewed today include:   Calcium score 01/22/2018 IMPRESSION: Coronary calcium score of 0. This was 0 percentile for age and sex matched control.       holter 2016  Frequent symptomatic PACs - 1400 in 24 hours, no runs.   I would recommend to start Flecainide 50 mg po BID and schedule an exercise treadmill stress test 1 weeks after starting.       ASSESSMENT:    1. Family history of early CAD   2. Hyperlipidemia, unspecified hyperlipidemia type      PLAN:  In order of problems listed above:  Family history of early CAD calcium score of 02/2018-no chest pain, very active. F/u in 1 yr.  Hyperlipidemia now on rosuvastatin LDL 47 10/2018. Continue current dose.    Medication Adjustments/Labs and Tests Ordered: Current medicines are reviewed at length with the patient today.  Concerns regarding medicines are outlined above.  Medication changes, Labs and Tests ordered today are listed in the Patient Instructions below. Patient Instructions  Medication Instructions:  Your physician recommends that you continue on your current medications as directed. Please refer to the Current Medication list given to you today.  *If you need a refill on your cardiac medications before your next appointment, please call your pharmacy*   Lab Work: None ordered  If you have labs (blood work) drawn today and your tests are completely normal, you will receive your results only by: Marland Kitchen MyChart Message (if you have MyChart) OR . A paper copy in the mail If you have any lab test that is abnormal or we need to change your  treatment, we will call you to review the results.   Testing/Procedures: None ordered   Follow-Up: At [facility], you and your health needs are our priority.  As part of our continuing mission to provide you with exceptional heart care, we have created designated Provider Care Teams.  These Care Teams include your primary Cardiologist (physician) and Advanced Practice Providers (APPs -  Physician Assistants and Nurse Practitioners) who all work together to provide you with the care you need, when you need it.  We recommend signing up for the patient portal called "MyChart".  Sign up information is provided  on this After Visit Summary.  MyChart is used to connect with patients for Virtual Visits (Telemedicine).  Patients are able to view lab/test results, encounter notes, upcoming appointments, etc.  Non-urgent messages can be sent to your provider as well.   To learn more about what you can do with MyChart, go to [email or web address].    Your next appointment:   12 month(s)  The format for your next appointment:   In Person  Provider:   You may see Ena Dawley, MD or one of the following Advanced Practice Providers on your designated Care Team:    Melina Copa, PA-C  Ermalinda Barrios, PA-C    Other Instructions      Signed, Ermalinda Barrios, PA-C  05/03/2019 10:44 AM    [address], [place], [place]  [address] Phone: [phone]; Fax: [phone]

## 2019-05-03 ENCOUNTER — Other Ambulatory Visit: Payer: Self-pay

## 2019-05-03 ENCOUNTER — Ambulatory Visit (INDEPENDENT_AMBULATORY_CARE_PROVIDER_SITE_OTHER): Payer: BC Managed Care – PPO | Admitting: Physician Assistant

## 2019-05-03 ENCOUNTER — Encounter: Payer: Self-pay | Admitting: Physician Assistant

## 2019-05-03 VITALS — BP 118/76 | HR 70 | Ht 72.0 in | Wt 172.0 lb

## 2019-05-03 DIAGNOSIS — E785 Hyperlipidemia, unspecified: Secondary | ICD-10-CM | POA: Diagnosis not present

## 2019-05-03 DIAGNOSIS — Z8249 Family history of ischemic heart disease and other diseases of the circulatory system: Secondary | ICD-10-CM

## 2019-05-03 MED ORDER — ROSUVASTATIN CALCIUM 10 MG PO TABS: ORAL_TABLET | ORAL | 3 refills | Status: DC

## 2019-05-03 NOTE — Patient Instructions (Signed)
Medication Instructions:  Your physician recommends that you continue on your current medications as directed. Please refer to the Current Medication list given to you today.  *If you need a refill on your cardiac medications before your next appointment, please call your pharmacy*   Lab Work: None ordered  If you have labs (blood work) drawn today and your tests are completely normal, you will receive your results only by: . MyChart Message (if you have MyChart) OR . A paper copy in the mail If you have any lab test that is abnormal or we need to change your treatment, we will call you to review the results.   Testing/Procedures: None ordered   Follow-Up: At [facility], you and your health needs are our priority.  As part of our continuing mission to provide you with exceptional heart care, we have created designated Provider Care Teams.  These Care Teams include your primary Cardiologist (physician) and Advanced Practice Providers (APPs -  Physician Assistants and Nurse Practitioners) who all work together to provide you with the care you need, when you need it.  We recommend signing up for the patient portal called "MyChart".  Sign up information is provided on this After Visit Summary.  MyChart is used to connect with patients for Virtual Visits (Telemedicine).  Patients are able to view lab/test results, encounter notes, upcoming appointments, etc.  Non-urgent messages can be sent to your provider as well.   To learn more about what you can do with MyChart, go to [email or web address].    Your next appointment:   12 month(s)  The format for your next appointment:   In Person  Provider:   You may see Katarina Nelson, MD or one of the following Advanced Practice Providers on your designated Care Team:    Dayna Dunn, PA-C  Michele Lenze, PA-C    Other Instructions  

## 2019-05-11 ENCOUNTER — Other Ambulatory Visit: Payer: Self-pay

## 2019-05-11 MED ORDER — ROSUVASTATIN CALCIUM 10 MG PO TABS: ORAL_TABLET | ORAL | 3 refills | Status: DC

## 2019-05-11 NOTE — Telephone Encounter (Signed)
Pt's medication was sent to pt's pharmacy as requested. Confirmation received.  °

## 2019-06-14 DIAGNOSIS — H43391 Other vitreous opacities, right eye: Secondary | ICD-10-CM | POA: Diagnosis not present

## 2019-09-12 ENCOUNTER — Encounter: Payer: Self-pay | Admitting: Internal Medicine

## 2019-10-14 ENCOUNTER — Encounter: Payer: Self-pay | Admitting: Internal Medicine

## 2019-11-16 ENCOUNTER — Encounter: Payer: BC Managed Care – PPO | Admitting: Internal Medicine

## 2019-12-20 ENCOUNTER — Ambulatory Visit (AMBULATORY_SURGERY_CENTER): Payer: Self-pay | Admitting: *Deleted

## 2019-12-20 ENCOUNTER — Other Ambulatory Visit: Payer: Self-pay

## 2019-12-20 VITALS — Ht 72.0 in | Wt 174.2 lb

## 2019-12-20 DIAGNOSIS — Z86010 Personal history of colonic polyps: Secondary | ICD-10-CM

## 2019-12-20 MED ORDER — SUPREP BOWEL PREP KIT 17.5-3.13-1.6 GM/177ML PO SOLN: 1.0000 | Freq: Once | ORAL | 0 refills | Status: AC

## 2019-12-20 NOTE — Progress Notes (Signed)
Patient denies any allergies to egg or soy products. Patient denies complications with anesthesia/sedation.  Patient denies oxygen use at home and denies diet medications. Emmi instructions for colonoscopy explained and given to patient. Patient has been fully vaccinated (pfizer vaccine) for covid including the booster on 11/15/19.

## 2019-12-21 ENCOUNTER — Encounter: Payer: Self-pay | Admitting: Internal Medicine

## 2020-01-02 ENCOUNTER — Encounter: Payer: Self-pay | Admitting: Certified Registered Nurse Anesthetist

## 2020-01-03 ENCOUNTER — Other Ambulatory Visit: Payer: Self-pay

## 2020-01-03 ENCOUNTER — Encounter: Payer: Self-pay | Admitting: Internal Medicine

## 2020-01-03 ENCOUNTER — Other Ambulatory Visit: Payer: Self-pay | Admitting: Internal Medicine

## 2020-01-03 ENCOUNTER — Ambulatory Visit (AMBULATORY_SURGERY_CENTER): Payer: BC Managed Care – PPO | Admitting: Internal Medicine

## 2020-01-03 VITALS — BP 119/75 | HR 62 | Temp 97.8°F | Resp 11 | Ht 72.0 in | Wt 174.2 lb

## 2020-01-03 DIAGNOSIS — K635 Polyp of colon: Secondary | ICD-10-CM | POA: Diagnosis not present

## 2020-01-03 DIAGNOSIS — Z86010 Personal history of colonic polyps: Secondary | ICD-10-CM

## 2020-01-03 DIAGNOSIS — D122 Benign neoplasm of ascending colon: Secondary | ICD-10-CM

## 2020-01-03 MED ORDER — SODIUM CHLORIDE 0.9 % IV SOLN: 500.0000 mL | Freq: Once | INTRAVENOUS | Status: DC

## 2020-01-03 NOTE — Progress Notes (Signed)
Pt's states no medical or surgical changes since previsit or office visit. 

## 2020-01-03 NOTE — Progress Notes (Signed)
Called to room to assist during endoscopic procedure.  Patient ID and intended procedure confirmed with present staff. Received instructions for my participation in the procedure from the performing physician.  

## 2020-01-03 NOTE — Op Note (Signed)
[facility] Patient Name: Justin Stanley Procedure Date: 01/03/2020 7:30 AM MRN: [id number] Endoscopist: Jerene Bears , MD Age: 54 Referring MD:  Date of Birth: 08/18/1965 Gender: Male Account #: [id number] Procedure:                Colonoscopy Indications:              High risk colon cancer surveillance: Personal                            history of sessile serrated colon polyps x2 (one 10                            mm or greater in size) + tubular adenoma x 1, Last                            colonoscopy: July 2018 Medicines:                Monitored Anesthesia Care Procedure:                Pre-Anesthesia Assessment:                           - Prior to the procedure, a History and Physical                            was performed, and patient medications and                            allergies were reviewed. The patient's tolerance of                            previous anesthesia was also reviewed. The risks                            and benefits of the procedure and the sedation                            options and risks were discussed with the patient.                            All questions were answered, and informed consent                            was obtained. Prior Anticoagulants: The patient has                            taken no previous anticoagulant or antiplatelet                            agents. ASA Grade Assessment: II - A patient with                            mild systemic disease. After reviewing the risks  and benefits, the patient was deemed in                            satisfactory condition to undergo the procedure.                           After obtaining informed consent, the colonoscope                            was passed under direct vision. Throughout the                            procedure, the patient's blood pressure, pulse, and                            oxygen saturations were monitored  continuously. The                            Colonoscope was introduced through the anus and                            advanced to the cecum, identified by appendiceal                            orifice and ileocecal valve. The colonoscopy was                            performed without difficulty. The patient tolerated                            the procedure well. The quality of the bowel                            preparation was excellent. The ileocecal valve,                            appendiceal orifice, and rectum were photographed. Scope In: 8:05:19 AM Scope Out: 8:22:26 AM Scope Withdrawal Time: 0 hours 14 minutes 21 seconds  Total Procedure Duration: 0 hours 17 minutes 7 seconds  Findings:                 The digital rectal exam was normal.                           A 5 mm polyp was found in the ascending colon. The                            polyp was sessile. The polyp was removed with a                            cold snare. Resection and retrieval were complete.                           Multiple small and large-mouthed diverticula were  found in the sigmoid colon, descending colon,                            hepatic flexure and ascending colon.                           The retroflexed view of the distal rectum and anal                            verge was normal and showed no anal or rectal                            abnormalities. Complications:            No immediate complications. Estimated Blood Loss:     Estimated blood loss was minimal. Impression:               - One 5 mm polyp in the ascending colon, removed                            with a cold snare. Resected and retrieved.                           - Diverticulosis in the sigmoid colon, in the                            descending colon, at the hepatic flexure and in the                            ascending colon.                           - The distal rectum and anal verge are  normal on                            retroflexion view. Recommendation:           - Patient has a contact number available for                            emergencies. The signs and symptoms of potential                            delayed complications were discussed with the                            patient. Return to normal activities tomorrow.                            Written discharge instructions were provided to the                            patient.                           - Resume previous diet.                           -  Continue present medications.                           - Await pathology results.                           - Repeat colonoscopy in 5 years for surveillance. Jerene Bears, MD 01/03/2020 8:33:03 AM This report has been signed electronically.

## 2020-01-03 NOTE — Progress Notes (Signed)
Report given to PACU, vss 

## 2020-01-03 NOTE — Patient Instructions (Signed)
Read all of the handouts given to you by your recovery room nurse.  YOU HAD AN ENDOSCOPIC PROCEDURE TODAY AT THE [facility]:   Refer to the procedure report that was given to you for any specific questions about what was found during the examination.  If the procedure report does not answer your questions, please call your gastroenterologist to clarify.  If you requested that your care partner not be given the details of your procedure findings, then the procedure report has been included in a sealed envelope for you to review at your convenience later.  YOU SHOULD EXPECT: Some feelings of bloating in the abdomen. Passage of more gas than usual.  Walking can help get rid of the air that was put into your GI tract during the procedure and reduce the bloating. If you had a lower endoscopy (such as a colonoscopy or flexible sigmoidoscopy) you may notice spotting of blood in your stool or on the toilet paper. If you underwent a bowel prep for your procedure, you may not have a normal bowel movement for a few days.  Please Note:  You might notice some irritation and congestion in your nose or some drainage.  This is from the oxygen used during your procedure.  There is no need for concern and it should clear up in a day or so.  SYMPTOMS TO REPORT IMMEDIATELY:  Following lower endoscopy (colonoscopy or flexible sigmoidoscopy):  Excessive amounts of blood in the stool  Significant tenderness or worsening of abdominal pains  Swelling of the abdomen that is new, acute  Fever of 100F or higher   For urgent or emergent issues, a gastroenterologist can be reached at any hour by calling [phone]. Do not use MyChart messaging for urgent concerns.    DIET:  We do recommend a small meal at first, but then you may proceed to your regular diet.  Drink plenty of fluids but you should avoid alcoholic beverages for 24 hours. Try to increase the fiber in your diet, and drink plenty of  water.  ACTIVITY:  You should plan to take it easy for the rest of today and you should NOT DRIVE or use heavy machinery until tomorrow (because of the sedation medicines used during the test).    FOLLOW UP: Our staff will call the number listed on your records 48-72 hours following your procedure to check on you and address any questions or concerns that you may have regarding the information given to you following your procedure. If we do not reach you, we will leave a message.  We will attempt to reach you two times.  During this call, we will ask if you have developed any symptoms of COVID 19. If you develop any symptoms (ie: fever, flu-like symptoms, shortness of breath, cough etc.) before then, please call [phone].  If you test positive for Covid 19 in the 2 weeks post procedure, please call and report this information to us.    If any biopsies were taken you will be contacted by phone or by letter within the next 1-3 weeks.  Please call us at [phone] if you have not heard about the biopsies in 3 weeks.    SIGNATURES/CONFIDENTIALITY: You and/or your care partner have signed paperwork which will be entered into your electronic medical record.  These signatures attest to the fact that that the information above on your After Visit Summary has been reviewed and is understood.  Full responsibility of the confidentiality of this   discharge information lies with you and/or your care-partner.  

## 2020-01-04 ENCOUNTER — Telehealth: Payer: Self-pay

## 2020-01-04 NOTE — Telephone Encounter (Signed)
°  Follow up Call-  Call back number 01/03/2020  Post procedure Call Back phone  # [phone]  Permission to leave phone message Yes  Some recent data might be hidden     Follow up call made.  NALM

## 2020-01-13 ENCOUNTER — Encounter: Payer: Self-pay | Admitting: Internal Medicine

## 2020-02-16 ENCOUNTER — Other Ambulatory Visit: Payer: Self-pay

## 2020-02-16 MED ORDER — ROSUVASTATIN CALCIUM 10 MG PO TABS
ORAL_TABLET | ORAL | 3 refills | Status: DC
Start: 2020-02-16 — End: 2021-04-10

## 2020-04-21 IMAGING — CT CT HEART SCORING
2 series · 16 of 20 positions shown, 18 images · non-contrast
Comparison: None.

Addendum:
EXAM:
OVER-READ INTERPRETATION  CT CHEST

The following report is an over-read performed by radiologist Dr.
Alexus Rayas [REDACTED] on 01/22/2018. This
over-read does not include interpretation of cardiac or coronary
anatomy or pathology. The coronary calcium score interpretation by
the cardiologist is attached.
CLINICAL DATA: Risk stratification
Coronary Calcium Score
TECHNIQUE: The patient was scanned on a Siemens Force scanner. Axial
non-contrast 3 mm slices were carried out through the heart. The
data set was analyzed on a dedicated work station and scored using
the Agatson method.

[Series 3: casc 3.0 i36f 2 bestdiast 74 % · axial · 0.28mm/px · z∈[+1455,+1572]mm · 8 of 51 slices shown, 10 images]
[im 6/51  vessel]
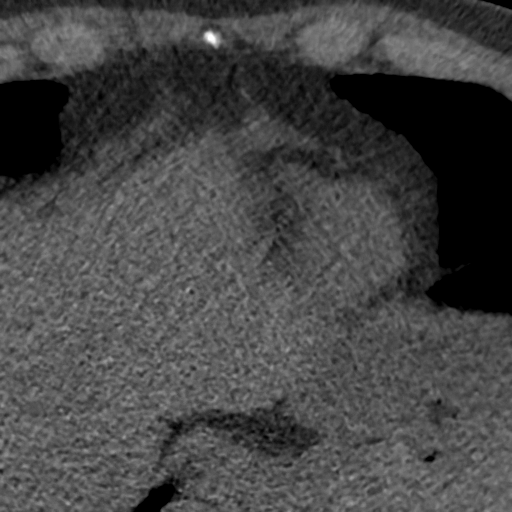
[im 6/51  lung]
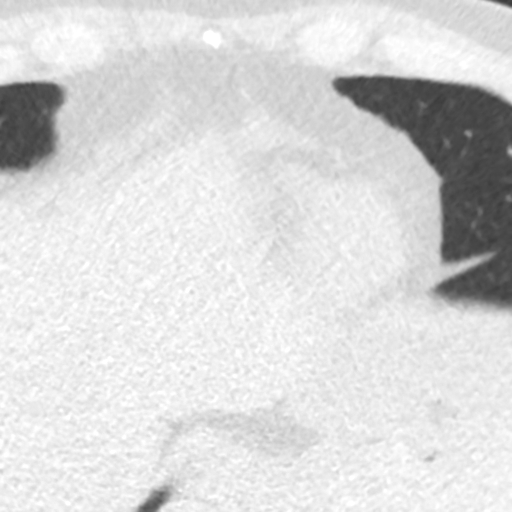
[im 12/51  vessel]
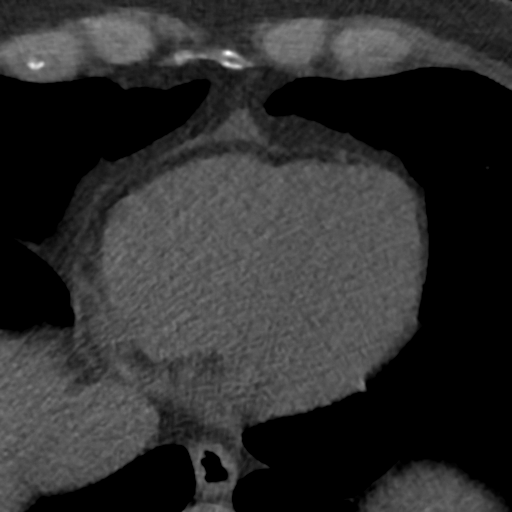
[im 17/51  vessel]
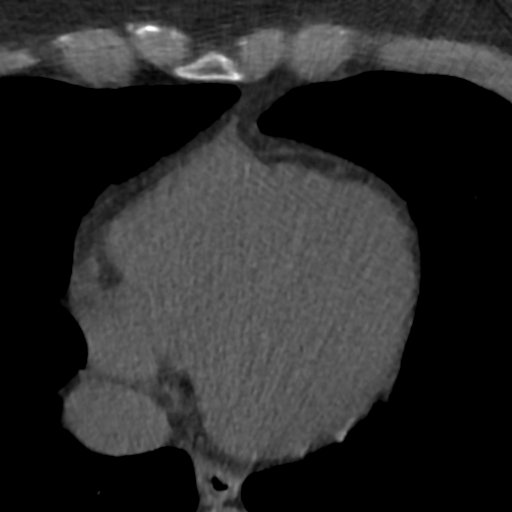
[im 23/51  vessel]
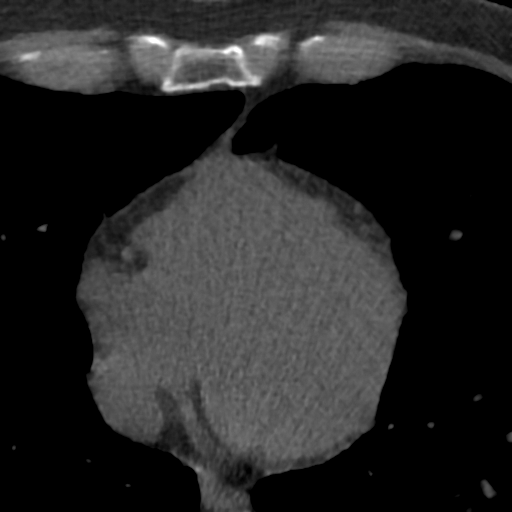
[im 28/51  vessel]
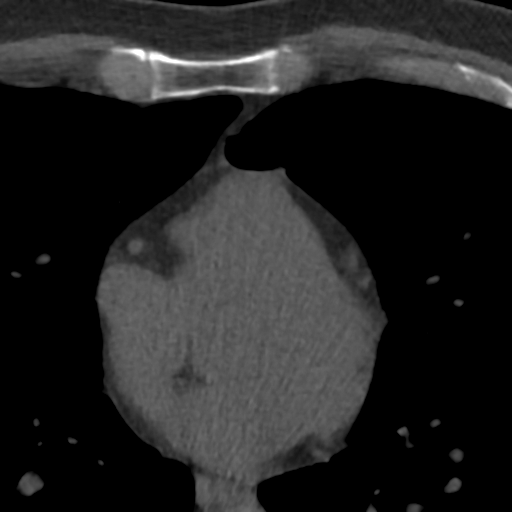
[im 28/51  lung]
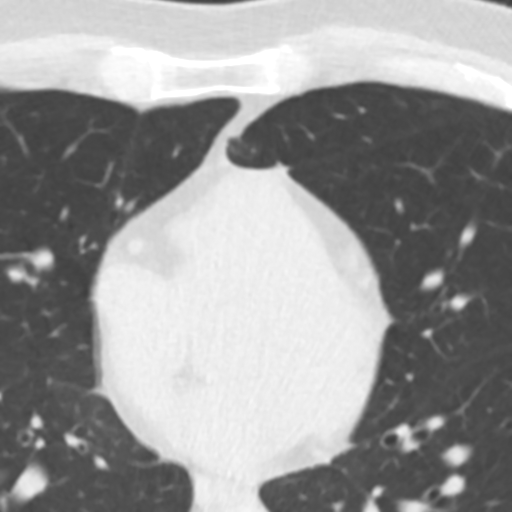
[im 34/51  vessel]
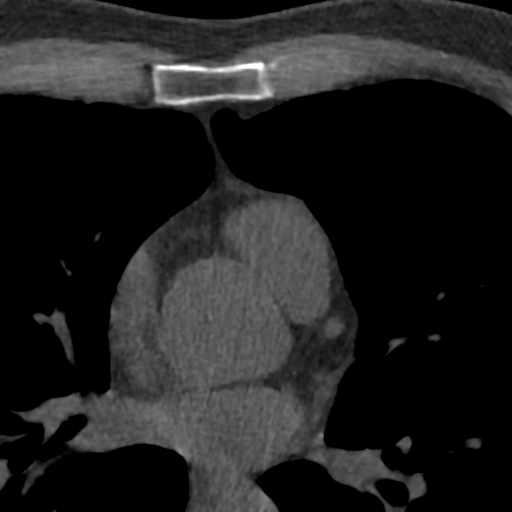
[im 39/51  vessel]
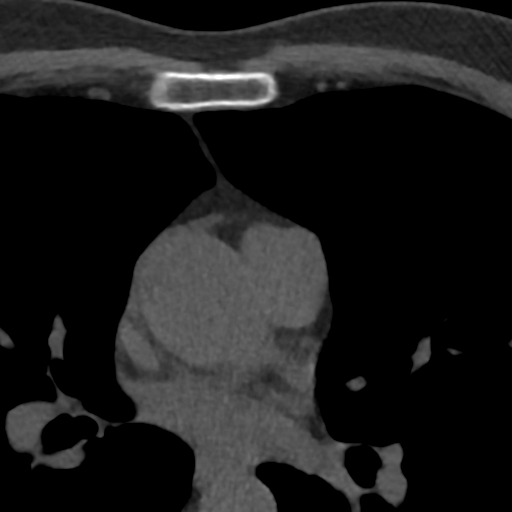
[im 45/51  vessel]
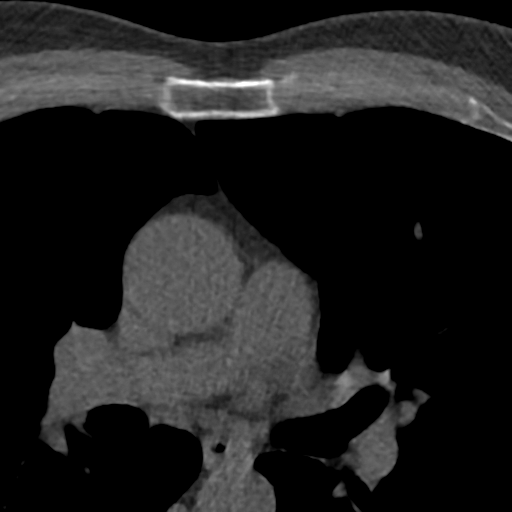

[Series 5: lung st 76 % · axial · 0.67mm/px · z∈[+1464,+1580]mm · 8 of 51 slices shown]
[im 6/51  lung]
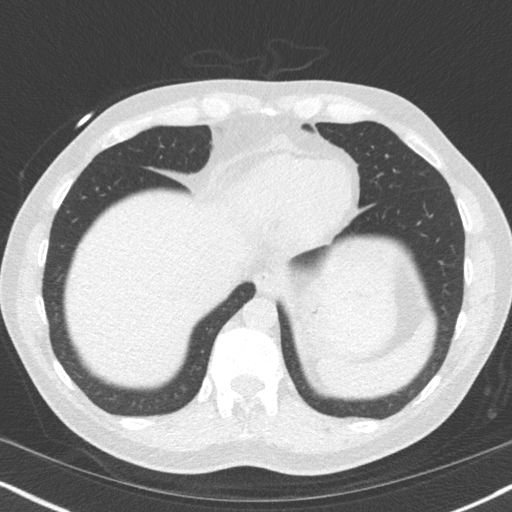
[im 12/51  lung]
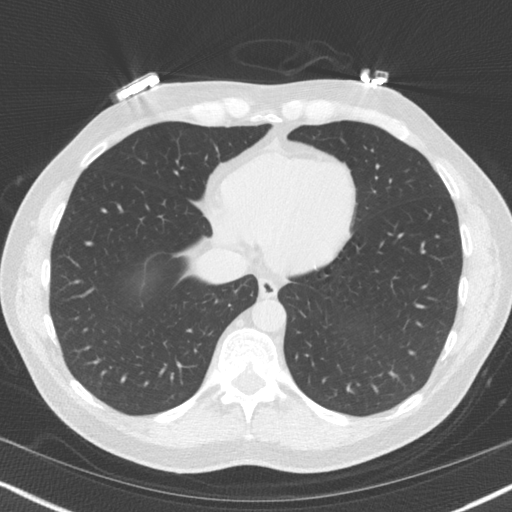
[im 17/51  lung]
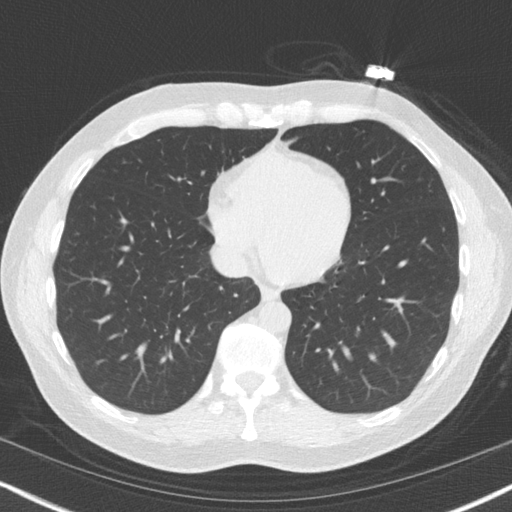
[im 23/51  lung]
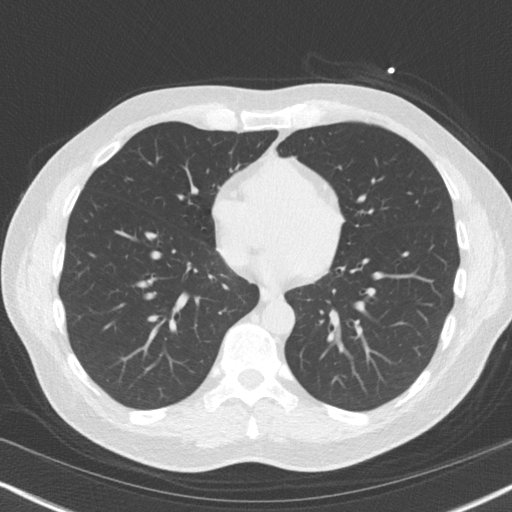
[im 28/51  lung]
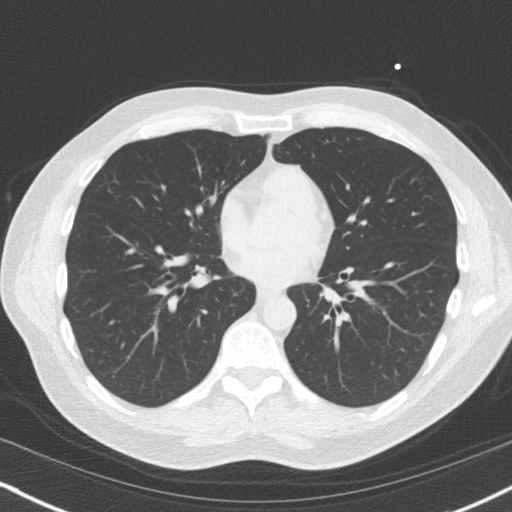
[im 34/51  lung]
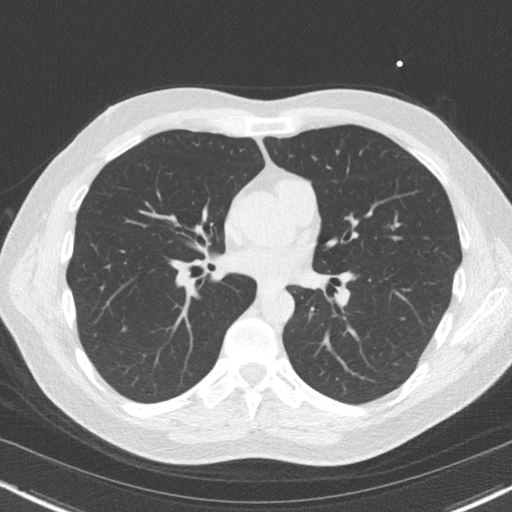
[im 39/51  lung]
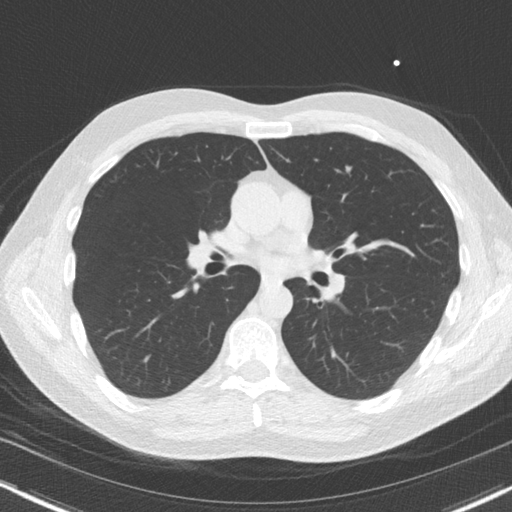
[im 45/51  lung]
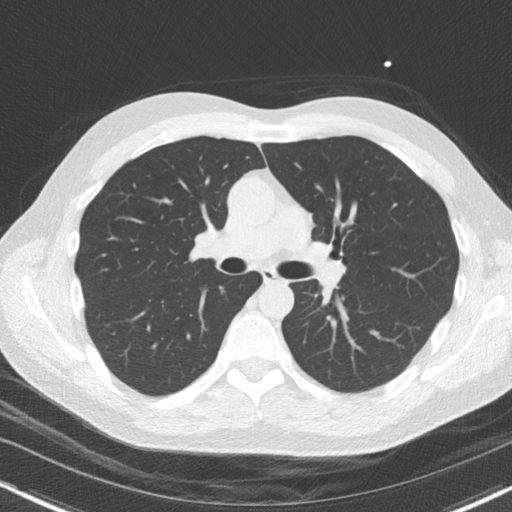

[16 of 20 positions shown; findings below may reference images not displayed]

FINDINGS: Vascular: No incidental findings.

Mediastinum/Nodes: Visualized mediastinum and hilar regions show a
few tiny precarinal lymph nodes. No enlarged lymph nodes identified.

Lungs/Pleura: Visualized lungs show no evidence of pulmonary edema,
consolidation, pneumothorax, nodule or pleural fluid.

Upper Abdomen: No acute abnormality.

Musculoskeletal: No chest wall mass or suspicious bone lesions
identified.
IMPRESSION: No incidental findings.
FINDINGS: Non-cardiac: See separate report from [REDACTED].

Ascending Aorta: Normal size.  No calcifications.

Pericardium: Normal, trivial calcifications.

Coronary arteries: Normal origin.  Right dominance.
IMPRESSION: Coronary calcium score of 0. This was 0 percentile for age and sex
matched control.

*** End of Addendum ***

## 2021-04-10 ENCOUNTER — Other Ambulatory Visit: Payer: Self-pay | Admitting: Physician Assistant

## 2021-05-20 NOTE — Progress Notes (Signed)
? ? ?Office Visit  ?  ?Patient Name: Justin Stanley ?Date of Encounter: 05/22/2021 ? ?Primary Care Provider:  Berle Mull, MD ?Primary Cardiologist:  Buford Dresser, MD ?Primary Electrophysiologist: None ?Chief Complaint  ?  ?Follow-up to establish care with new provider ? ? Patient Profile: ?FH of early CAD ?HLD ?BPH ? ? Recent Studies: ?CT cardiac scoring: Calcium score of 0, incidental finding of small pulmonary nodule right lung. ?09/2014 Holter monitor: 1300 PACs, no runs ?01/2018 CT cardiac scoring: Calcium score 0, normal ? ?History of Present Illness  ?  ?Justin Stanley is a 56 y.o. male with PMH of early CAD family history, hyperlipidemia on Crestor.  He is a former patient of Dr. Francesca Oman and was last seen on 3/21 by Pecola Leisure, [place].  During visit patient denied any chest pain, SOB, dizziness.  Patient had coronary CT completed with calcium score of 0 and normal coronaries.  Patient wore Holter monitor in 8/16 that showed 1300 PACs, no runs of sustained tachycardia. ? ?Since last being seen in our clinic the patient reports doing well and continues to exercise and maintain a healthy lifestyle.  He is a former patient of Dr. Francesca Oman and is here today to establish care with Dr. Harrell Gave.  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  He denies chest pain, palpitations, dyspnea, PND, orthopnea, nausea, vomiting, dizziness, syncope, edema, weight gain, or early satiety.  He denies any palpitations in the last 2 years and has not experienced any side effects with his Crestor.  He recently had biometric screening done that revealed a cholesterol of 0.  I advised that we will recheck labs today and will make recommendations on continuing Crestor or stopping Crestor based on findings. ? ?Past Medical History  ?  ?Past Medical History:  ?Diagnosis Date  ? Allergy   ? cats and ragweed  ? Arrhythmia   ? Hyperlipidemia   ? ?Past Surgical History:  ?Procedure Laterality Date  ? COLONOSCOPY   09/08/2016  ? [place] - polyps  ? KNEE ARTHROSCOPY  1986  ? right  ? SKIN LESION EXCISION  2016, 2018  ? TONGUE BIOPSY  2008  ? dentist  ? ? ?Allergies ? ?No Known Allergies ? ?Home Medications  ?  ?Current Outpatient Medications  ?Medication Sig Dispense Refill  ? cetirizine (ZYRTEC) 10 MG tablet Take 10 mg by mouth daily.    ? Multiple Vitamin (MULTIVITAMIN) tablet Take 1 tablet by mouth daily. CENTRUM SILVER    ? rosuvastatin (CRESTOR) 10 MG tablet TAKE 1 TABLET DAILY. Please make overdue appt with Cardiologist before anymore refills. Thank you 1st attempt 30 tablet 0  ? silodosin (RAPAFLO) 4 MG CAPS capsule Take 4 mg by mouth daily.    ? ?No current facility-administered medications for this visit.  ?  ? ?Review of Systems  ?Please see the history of present illness.    ? ?All other systems reviewed and are otherwise negative except as noted above. ? ?Physical Exam  ?  ?Wt Readings from Last 3 Encounters:  ?05/22/21 171 lb 1.6 oz (77.6 kg)  ?01/03/20 174 lb 3.2 oz (79 kg)  ?12/20/19 174 lb 3.2 oz (79 kg)  ? ?VS: ?Vitals:  ? 05/22/21 0857  ?BP: 126/82  ?Pulse: 65  ?,Body mass index is 23.21 kg/m?. ? ?Constitutional:   ?   Appearance: Healthy appearance. Not in distress.  ?Neck:  ?   Vascular: JVD normal.  ?Pulmonary:  ?   Effort: Pulmonary effort is normal.  ?  Breath sounds: No wheezing. No rales.  ?Cardiovascular:  ?   Normal rate. Regular rhythm. Normal S1. Normal S2.   ?   Murmurs: There is no murmur.  ?Edema: ?   Peripheral edema absent.  ?Abdominal:  ?   Palpations: Abdomen is soft. There is no hepatomegaly.  ?Skin: ?   General: Skin is warm and dry.  ?Neurological:  ?   General: No focal deficit present.  ?   Mental Status: Alert and oriented to person, place and time.  ?   Cranial Nerves: Cranial nerves are intact.  ?EKG/LABS/Other Studies Reviewed  ?  ?ECG personally reviewed by me today -sinus rhythm with rate of 65- no acute changes. ? ?Lab Results  ?Component Value Date  ? WBC 6.6 01/22/2018  ? HGB  16.2 01/22/2018  ? HCT 45.8 01/22/2018  ? MCV 88 01/22/2018  ? PLT 204 01/22/2018  ? ?Lab Results  ?Component Value Date  ? CREATININE 0.95 02/25/2018  ? BUN 13 02/25/2018  ? NA 139 02/25/2018  ? K 4.3 02/25/2018  ? CL 101 02/25/2018  ? CO2 22 02/25/2018  ? ?Lab Results  ?Component Value Date  ? ALT 22 02/25/2018  ? AST 22 02/25/2018  ? ALKPHOS 61 02/25/2018  ? BILITOT 0.6 02/25/2018  ? ?No results found for: CHOL, HDL, LDLCALC, LDLDIRECT, TRIG, CHOLHDL  ?No results found for: HGBA1C ? ?Assessment & Plan  ?  ?1.  Palpitations: ?-Patient states that he has not experienced any palpitations in the last 2 years.  He is currently exercising and maintaining a heart healthy lifestyle. ?-Advised him to continue to report any palpitations that he may experience to [place] in the future. ? ?2.  Hyperlipidemia: ?-Last LDL was 106 on 1/20, he recently completed biometric screening that showed total cholesterol of 0.  We will repeat his lipids and liver functions tomorrow.  Due to patient having breakfast this morning. ?-Continue Crestor 10 mg pending results of cholesterol labs. ? ?3.  Family history of early CAD: ?-Patient reports no new cardiovascular related issues or concerns since last year.  He is compliant with cholesterol medications and maintains a low-sodium heart healthy diet with regular exercise. ? ?Disposition: Follow-up with Buford Dresser, MD or APP in 1 year ?   ?Medication Adjustments/Labs and Tests Ordered: ?Current medicines are reviewed at length with the patient today.  Concerns regarding medicines are outlined above.  ?Tests Ordered: ?Orders Placed This Encounter  ?Procedures  ? Lipid panel  ? Hepatic function panel  ? EKG 12-Lead  ? ?Medication Changes: ?No orders of the defined types were placed in this encounter. ? ? ?Signed, ?Mable Fill, Marissa Nestle, NP ?05/22/2021, 9:34 AM ?[place] ?

## 2021-05-22 ENCOUNTER — Ambulatory Visit (INDEPENDENT_AMBULATORY_CARE_PROVIDER_SITE_OTHER): Payer: BC Managed Care – PPO | Admitting: Nurse Practitioner

## 2021-05-22 ENCOUNTER — Encounter (HOSPITAL_BASED_OUTPATIENT_CLINIC_OR_DEPARTMENT_OTHER): Payer: Self-pay | Admitting: Nurse Practitioner

## 2021-05-22 VITALS — BP 126/82 | HR 65 | Ht 72.0 in | Wt 171.1 lb

## 2021-05-22 DIAGNOSIS — R002 Palpitations: Secondary | ICD-10-CM

## 2021-05-22 DIAGNOSIS — Z8249 Family history of ischemic heart disease and other diseases of the circulatory system: Secondary | ICD-10-CM | POA: Diagnosis not present

## 2021-05-22 DIAGNOSIS — E785 Hyperlipidemia, unspecified: Secondary | ICD-10-CM

## 2021-05-22 NOTE — Patient Instructions (Signed)
Medication Instructions:  ?Your Physician recommend you continue on your current medication as directed.   ? ?*If you need a refill on your cardiac medications before your next appointment, please call your pharmacy* ? ? ?Lab Work: ?Please return for Lab work tomorrow morning for a Fasting Lipid Panel and Liver Function Test. You may come to the...  ? ?[facility] (3rd floor) ?[address] [place], [place]  ?Open: 8am-Noon and 1pm-4:30pm  ? ?[facility] ?[place]  ? ?[employer]- Any location ? ?**no appointments needed** ? ?If you have labs (blood work) drawn today and your tests are completely normal, you will receive your results only by: ?MyChart Message (if you have MyChart) OR ?A paper copy in the mail ?If you have any lab test that is abnormal or we need to change your treatment, we will call you to review the results. ? ?Follow-Up: ?At [facility], you and your health needs are our priority.  As part of our continuing mission to provide you with exceptional heart care, we have created designated Provider Care Teams.  These Care Teams include your primary Cardiologist (physician) and Advanced Practice Providers (APPs -  Physician Assistants and Nurse Practitioners) who all work together to provide you with the care you need, when you need it. ? ?We recommend signing up for the patient portal called "MyChart".  Sign up information is provided on this After Visit Summary.  MyChart is used to connect with patients for Virtual Visits (Telemedicine).  Patients are able to view lab/test results, encounter notes, upcoming appointments, etc.  Non-urgent messages can be sent to your provider as well.   ?To learn more about what you can do with MyChart, go to [email or web address].   ? ?Your next appointment:   ?1 year(s) ? ?The format for your next appointment:   ?In Person ? ?Provider:   ?Buford Dresser, MD{ ? ?Important Information  About Sugar ? ? ? ? ? ? ?

## 2021-05-24 ENCOUNTER — Telehealth (HOSPITAL_BASED_OUTPATIENT_CLINIC_OR_DEPARTMENT_OTHER): Payer: Self-pay

## 2021-05-24 LAB — HEPATIC FUNCTION PANEL
ALT: 20 IU/L (ref 0–44)
AST: 23 IU/L (ref 0–40)
Albumin: 4.6 g/dL (ref 3.8–4.9)
Alkaline Phosphatase: 68 IU/L (ref 44–121)
Bilirubin Total: 0.7 mg/dL (ref 0.0–1.2)
Bilirubin, Direct: 0.17 mg/dL (ref 0.00–0.40)
Total Protein: 6.5 g/dL (ref 6.0–8.5)

## 2021-05-24 LAB — LIPID PANEL
Chol/HDL Ratio: 4.3 ratio (ref 0.0–5.0)
Cholesterol, Total: 152 mg/dL (ref 100–199)
HDL: 35 mg/dL — ABNORMAL LOW (ref >39)
LDL Chol Calc (NIH): 100 mg/dL — ABNORMAL HIGH (ref 0–99)
Triglycerides: 92 mg/dL (ref 0–149)
VLDL Cholesterol Cal: 17 mg/dL (ref 5–40)

## 2021-05-24 MED ORDER — ROSUVASTATIN CALCIUM 10 MG PO TABS: ORAL_TABLET | ORAL | 3 refills | Status: DC

## 2021-05-24 NOTE — Telephone Encounter (Addendum)
? ? ? ?-----   Message from Marylu Lund., NP sent at 05/24/2021  7:51 AM EDT ----- ?Good morning, ? ?Please let Mr.Goodine know that his bad cholesterol, the LDL are still right above goal at 100. His good cholesterol HDL are less than 39 which indicate that they are below goal. He can increase the HDL by eating foods such as cold water fish, avocados,and whole grains.  Your triglycerides and liver function were both normal.  Please continue your Crestor 10 mg and send in refills if needed.  Please reach out if you have any questions. ? ?Thank you, ?Ambrose Pancoast, NP ?

## 2021-05-24 NOTE — Telephone Encounter (Addendum)
Results called to patient who verbalizes understanding!  ? ?Refills sent ? ? ? ?----- Message from Marylu Lund., NP sent at 05/24/2021  7:51 AM EDT ----- ?Good morning, ? ?Please let Mr.Frances know that his bad cholesterol, the LDL are still right above goal at 100. His good cholesterol HDL are less than 39 which indicate that they are below goal. He can increase the HDL by eating foods such as cold water fish, avocados,and whole grains.  Your triglycerides and liver function were both normal.  Please continue your Crestor 10 mg and send in refills if needed.  Please reach out if you have any questions. ? ?Thank you, ?Ambrose Pancoast, NP ?

## 2022-05-14 ENCOUNTER — Other Ambulatory Visit (HOSPITAL_BASED_OUTPATIENT_CLINIC_OR_DEPARTMENT_OTHER): Payer: Self-pay | Admitting: Nurse Practitioner

## 2022-05-16 ENCOUNTER — Telehealth: Payer: Self-pay | Admitting: Cardiology

## 2022-05-16 MED ORDER — ROSUVASTATIN CALCIUM 10 MG PO TABS: 10.0000 mg | ORAL_TABLET | Freq: Every day | ORAL | 0 refills | Status: DC

## 2022-05-16 NOTE — Telephone Encounter (Signed)
Rx request sent to pharmacy.  

## 2022-05-16 NOTE — Telephone Encounter (Signed)
*  STAT* If patient is at the pharmacy, call can be transferred to refill team.   1. Which medications need to be refilled? (please list name of each medication and dose if known)   rosuvastatin (CRESTOR) 10 MG tablet    2. Which pharmacy/location (including street and city if local pharmacy) is medication to be sent to?[facility] - [place], [place] - [address]   3. Do they need a 30 day or 90 day supply? 90 Day Supply  Pt has an appt schedule at our soonest available day. Pt stated the pharmacy will only do a 90 day supply for the medication.

## 2022-06-27 ENCOUNTER — Telehealth (HOSPITAL_BASED_OUTPATIENT_CLINIC_OR_DEPARTMENT_OTHER): Payer: Self-pay | Admitting: Cardiology

## 2022-06-27 NOTE — Telephone Encounter (Signed)
What labs are you okay ordering prior to OV?

## 2022-06-27 NOTE — Telephone Encounter (Signed)
Patient is requesting lab orders be placed to have performed prior to his yearly f/u.   He would like a callback notifying him once the orders are placed.   Please advise.

## 2022-06-30 ENCOUNTER — Encounter (HOSPITAL_BASED_OUTPATIENT_CLINIC_OR_DEPARTMENT_OTHER): Payer: Self-pay

## 2022-06-30 DIAGNOSIS — E785 Hyperlipidemia, unspecified: Secondary | ICD-10-CM

## 2022-06-30 NOTE — Telephone Encounter (Signed)
Ok to order labs prior to appointment

## 2022-06-30 NOTE — Telephone Encounter (Signed)
Fasting lipid panel, CMP, CBC prior to clinic visit please.  Alver Sorrow, NP

## 2022-07-01 LAB — CBC
Hematocrit: 45.7 % (ref 37.5–51.0)
Hemoglobin: 15.9 g/dL (ref 13.0–17.7)
MCH: 31.1 pg (ref 26.6–33.0)
MCHC: 34.8 g/dL (ref 31.5–35.7)
MCV: 89 fL (ref 79–97)
Platelets: 181 10*3/uL (ref 150–450)
RBC: 5.11 x10E6/uL (ref 4.14–5.80)
RDW: 11.8 % (ref 11.6–15.4)
WBC: 5.8 10*3/uL (ref 3.4–10.8)

## 2022-07-01 LAB — COMPREHENSIVE METABOLIC PANEL
ALT: 19 IU/L (ref 0–44)
AST: 21 IU/L (ref 0–40)
Albumin/Globulin Ratio: 2 (ref 1.2–2.2)
Albumin: 4.5 g/dL (ref 3.8–4.9)
Alkaline Phosphatase: 70 IU/L (ref 44–121)
BUN/Creatinine Ratio: 17 (ref 9–20)
BUN: 14 mg/dL (ref 6–24)
Bilirubin Total: 0.8 mg/dL (ref 0.0–1.2)
CO2: 24 mmol/L (ref 20–29)
Calcium: 9.5 mg/dL (ref 8.7–10.2)
Chloride: 101 mmol/L (ref 96–106)
Creatinine, Ser: 0.83 mg/dL (ref 0.76–1.27)
Globulin, Total: 2.2 g/dL (ref 1.5–4.5)
Glucose: 92 mg/dL (ref 70–99)
Potassium: 4.5 mmol/L (ref 3.5–5.2)
Sodium: 137 mmol/L (ref 134–144)
Total Protein: 6.7 g/dL (ref 6.0–8.5)
eGFR: 101 mL/min/{1.73_m2} (ref >59)

## 2022-07-01 LAB — LIPID PANEL
Chol/HDL Ratio: 4.4 ratio (ref 0.0–5.0)
Cholesterol, Total: 162 mg/dL (ref 100–199)
HDL: 37 mg/dL — ABNORMAL LOW (ref >39)
LDL Chol Calc (NIH): 108 mg/dL — ABNORMAL HIGH (ref 0–99)
Triglycerides: 93 mg/dL (ref 0–149)
VLDL Cholesterol Cal: 17 mg/dL (ref 5–40)

## 2022-07-08 ENCOUNTER — Ambulatory Visit (INDEPENDENT_AMBULATORY_CARE_PROVIDER_SITE_OTHER): Payer: BC Managed Care – PPO | Admitting: Family

## 2022-07-08 ENCOUNTER — Encounter (HOSPITAL_BASED_OUTPATIENT_CLINIC_OR_DEPARTMENT_OTHER): Payer: Self-pay | Admitting: Family

## 2022-07-08 VITALS — BP 118/76 | HR 62 | Ht 72.0 in | Wt 164.9 lb

## 2022-07-08 DIAGNOSIS — R002 Palpitations: Secondary | ICD-10-CM

## 2022-07-08 DIAGNOSIS — Z8249 Family history of ischemic heart disease and other diseases of the circulatory system: Secondary | ICD-10-CM | POA: Diagnosis not present

## 2022-07-08 DIAGNOSIS — E7849 Other hyperlipidemia: Secondary | ICD-10-CM

## 2022-07-08 MED ORDER — ROSUVASTATIN CALCIUM 10 MG PO TABS: 10.0000 mg | ORAL_TABLET | Freq: Every day | ORAL | 3 refills | Status: DC

## 2022-07-08 NOTE — Patient Instructions (Signed)
Medication Instructions:  Your physician recommends that you continue on your current medications as directed. Please refer to the Current Medication list given to you today.  *If you need a refill on your cardiac medications before your next appointment, please call your pharmacy*   Lab Work: Please return for Lab work in October or November for Fasting Lipid Panel and Liver Function Tests. You may come to the...   [facility] (3rd floor) [address] [place], [place] [address]  Open: 8am-Noon and 1pm-4:30pm  Please ring the doorbell on the small table when you exit the elevator and the Lab Tech will come get you  [facility] at [facility] [address], [place], [place] [address] Open: 8am-1pm, then 2pm-4:30pm   [facility]- Please see attached locations sheet stapled to your lab work with address and hours.   If you have labs (blood work) drawn today and your tests are completely normal, you will receive your results only by: MyChart Message (if you have MyChart) OR A paper copy in the mail If you have any lab test that is abnormal or we need to change your treatment, we will call you to review the results.  Follow-Up: At [facility], you and your health needs are our priority.  As part of our continuing mission to provide you with exceptional heart care, we have created designated Provider Care Teams.  These Care Teams include your primary Cardiologist (physician) and Advanced Practice Providers (APPs -  Physician Assistants and Nurse Practitioners) who all work together to provide you with the care you need, when you need it.  We recommend signing up for the patient portal called "MyChart".  Sign up information is provided on this After Visit Summary.  MyChart is used to connect with patients for Virtual Visits (Telemedicine).  Patients are able to view lab/test results, encounter notes, upcoming appointments, etc.  Non-urgent  messages can be sent to your provider as well.   To learn more about what you can do with MyChart, go to [email or web address].    Your next appointment:   1 year with Dr. Cristal Deer or Gillian Shields, NP

## 2022-07-08 NOTE — Progress Notes (Signed)
Office Visit    Patient Name: Justin Stanley Date of Encounter: 07/08/2022  PCP:  Pati Gallo, MD   [facility]  Cardiologist:  Jodelle Red, MD  Advanced Practice Provider:  No care team member to display Electrophysiologist:  None      Chief Complaint    Justin Stanley is a 57 y.o. male presents today for hyperlipidemia follow up    Past Medical History    Past Medical History:  Diagnosis Date   Allergy    cats and ragweed   Arrhythmia    Hyperlipidemia    Past Surgical History:  Procedure Laterality Date   COLONOSCOPY  09/08/2016   Pyrtle - polyps   KNEE ARTHROSCOPY  1986   right   SKIN LESION EXCISION  2016, 2018   TONGUE BIOPSY  2008   dentist    Allergies  Allergies  Allergen Reactions   Keflex [Cephalexin]     Hives     History of Present Illness    Justin Stanley is a 57 y.o. male with a hx of HLD, BPH last seen 05/22/21.  Family history notable for early CAD  Prior patient of Dr. Delton See and has since established Dr. Cristal Deer.  Holter monitor August 2016 with 1300 PAC but no runs of sustained tachycardia.  Coronary CTA 01/2018 with coronary calcium score of 0.  Last seen by Robin Searing, NP 05/22/2021 doing well from a cardiac perspective.  Presents today for follow-up independently.  Retired last week from [employer].  Excited to travel more to [place] with his wife who is originally from [place].  Stays very active exercising on the treadmill, hiking, and weight training. Eats predominantly at home and one day a week of fish and one day a week vegetarian. Reports no shortness of breath nor dyspnea on exertion. Reports no chest pain, pressure, or tightness. No edema, orthopnea, PND. Reports no palpitations.    EKGs/Labs/Other Studies Reviewed:   The following studies were reviewed today: Cardiac Studies & Procedures          CT SCANS  CT CARDIAC SCORING (SELF PAY ONLY) 01/22/2018  Addendum  01/22/2018  5:05 PM ADDENDUM REPORT: 01/22/2018 17:02  CLINICAL DATA:  Risk stratification  EXAM: Coronary Calcium Score  TECHNIQUE: The patient was scanned on a [employer] scanner. Axial non-contrast 3 mm slices were carried out through the heart. The data set was analyzed on a dedicated work station and scored using the Agatson method.  FINDINGS: Non-cardiac: See separate report from [facility].  Ascending Aorta: Normal size.  No calcifications.  Pericardium: Normal, trivial calcifications.  Coronary arteries: Normal origin.  Right dominance.  IMPRESSION: Coronary calcium score of 0. This was 0 percentile for age and sex matched control.   Electronically Signed By: Tobias Alexander On: 01/22/2018 17:02  Narrative EXAM: OVER-READ INTERPRETATION  CT CHEST  The following report is an over-read performed by radiologist Dr. Irish Lack of [facility] on 01/22/2018. This over-read does not include interpretation of cardiac or coronary anatomy or pathology. The coronary calcium score interpretation by the cardiologist is attached.  COMPARISON:  None.  FINDINGS: Vascular: No incidental findings.  Mediastinum/Nodes: Visualized mediastinum and hilar regions show a few tiny precarinal lymph nodes. No enlarged lymph nodes identified.  Lungs/Pleura: Visualized lungs show no evidence of pulmonary edema, consolidation, pneumothorax, nodule or pleural fluid.  Upper Abdomen: No acute abnormality.  Musculoskeletal: No chest wall mass or suspicious bone lesions identified.  IMPRESSION: No  incidental findings.  Electronically Signed: By: Irish Lack M.D. On: 01/22/2018 14:55   CT SCANS  CT CARDIAC SCORING (SELF PAY ONLY) 07/17/2014  Addendum 07/17/2014  3:35 PM ADDENDUM REPORT: 07/17/2014 15:33  CLINICAL DATA:  Risk stratification  EXAM: Coronary Calcium Score  TECHNIQUE: The patient was scanned on a Siemens Sensation 16 slice  scanner. Axial non-contrast 3 mm slices were carried out through the heart. The data set was analyzed on a dedicated work station and scored using the Agatson method.  FINDINGS: Non-cardiac: See separate report from [facility].  Ascending Aorta:  Normal caliber.  Pericardium: Normal  Coronary arteries:  Normal origin.  IMPRESSION: Coronary calcium score of 0. This was 0 percentile for age and sex matched control.  Tobias Alexander   Electronically Signed By: Tobias Alexander On: 07/17/2014 15:33  Narrative EXAM: OVER-READ INTERPRETATION  CT CHEST  The following report is an over-read performed by radiologist Dr. Royal Piedra [facility] on 07/17/2014. This over-read does not include interpretation of cardiac or coronary anatomy or pathology. The coronary calcium score interpretation by the cardiologist is attached.  COMPARISON:  No priors.  FINDINGS: 3 mm pulmonary nodule in the lateral segment of the right middle lobe (image 36 of series 4). 4 mm right lower lobe pulmonary nodule (image 14 of series 4). Within the visualized portions of the thorax there is no acute consolidative airspace disease, no pleural effusions, no pneumothorax and no lymphadenopathy. Visualized portions of the upper abdomen are unremarkable. There are no aggressive appearing lytic or blastic lesions noted in the visualized portions of the skeleton.  IMPRESSION: 1. Small pulmonary nodules in the right lung, largest of which measures only 4 mm in the right lower lobe. If the patient is at high risk for bronchogenic carcinoma, follow-up chest CT at 1 year is recommended. If the patient is at low risk, no follow-up is needed. This recommendation follows the consensus statement: Guidelines for Management of Small Pulmonary Nodules Detected on CT Scans: A Statement from the Fleischner Society as published in Radiology 2005; 237:395-400.  Electronically Signed: By:  Trudie Reed M.D. On: 07/17/2014 11:28           EKG:  EKG is ordered today.  The ekg ordered today demonstrates NSR 62 bpm with no acute ST/T wave changes.   Recent Labs: 07/01/2022: ALT 19; BUN 14; Creatinine, Ser 0.83; Hemoglobin 15.9; Platelets 181; Potassium 4.5; Sodium 137  Recent Lipid Panel    Component Value Date/Time   CHOL 162 07/01/2022 0802   TRIG 93 07/01/2022 0802   HDL 37 (L) 07/01/2022 0802   CHOLHDL 4.4 07/01/2022 0802   LDLCALC 108 (H) 07/01/2022 0802    Home Medications   Current Meds  Medication Sig   cetirizine (ZYRTEC) 10 MG tablet Take 10 mg by mouth daily.   Multiple Vitamin (MULTIVITAMIN) tablet Take 1 tablet by mouth daily. CENTRUM SILVER   rosuvastatin (CRESTOR) 10 MG tablet Take 1 tablet (10 mg total) by mouth daily. Please keep your upcoming appointment for refills.   silodosin (RAPAFLO) 8 MG CAPS capsule Take 8 mg by mouth daily with breakfast.     Review of Systems      All other systems reviewed and are otherwise negative except as noted above.  Physical Exam    VS:  BP 118/76   Pulse 62   Ht 6' (1.829 m)   Wt 164 lb 14.4 oz (74.8 kg)   BMI 22.36 kg/m  , BMI Body  mass index is 22.36 kg/m.  Wt Readings from Last 3 Encounters:  07/08/22 164 lb 14.4 oz (74.8 kg)  05/22/21 171 lb 1.6 oz (77.6 kg)  01/03/20 174 lb 3.2 oz (79 kg)    GEN: Well nourished, well developed, in no acute distress. HEENT: normal. Neck: Supple, no JVD, carotid bruits, or masses. Cardiac: RRR, no murmurs, rubs, or gallops. No clubbing, cyanosis, edema.  Radials/PT 2+ and equal bilaterally.  Respiratory:  Respirations regular and unlabored, clear to auscultation bilaterally. GI: Soft, nontender, nondistended. MS: No deformity or atrophy. Skin: Warm and dry, no rash. Neuro:  Strength and sensation are intact. Psych: Normal affect.  Assessment & Plan    Palpitations- No recurrent palpitations. No indication for AV nodal blocking therapy.   Hyperlipidemia  / Elevated lipoprotein a- 02/25/18 Lp(a) elevated 176.7 (prior 01/2018 203.1). 07/01/2022 total cholesterol 62, triglyceride to 3, HL 37, LDL 108. LDL goal <100.  Discussed increasing rosuvastatin versus lifestyle changes and repeating labs.  He prefers to repeat lipid panel/LFT in 4 to 6 months.  Anticipate with retirement he will have more time for exercise however already stays very active and follows low-sodium, heart healthy diet.  If LDL not at goal less than 119 consider adjusting rosuvastatin to 20 mg 3 times per week and 10 mg every other day.  Will also inquire with Dr. Cristal Deer whether there is still enrollment in V1P prevention trial.  Family history of CAD - 2019 calcium score 0. Plan for primary prevention. Recommend aiming for 150 minutes of moderate intensity activity per week and following a heart healthy diet.  Lipid management, as above.  No anginal symptoms and no indication for ischemic evaluation.        Disposition: Follow up in 1 year(s) with Jodelle Red, MD or APP.  Signed, Alver Sorrow, NP 07/08/2022, 3:59 PM [facility]

## 2022-12-06 ENCOUNTER — Encounter (HOSPITAL_BASED_OUTPATIENT_CLINIC_OR_DEPARTMENT_OTHER): Payer: Self-pay

## 2022-12-10 ENCOUNTER — Telehealth: Payer: Self-pay | Admitting: Cardiology

## 2022-12-10 ENCOUNTER — Telehealth (HOSPITAL_BASED_OUTPATIENT_CLINIC_OR_DEPARTMENT_OTHER): Payer: Self-pay

## 2022-12-10 DIAGNOSIS — E785 Hyperlipidemia, unspecified: Secondary | ICD-10-CM

## 2022-12-10 LAB — HEPATIC FUNCTION PANEL
ALT: 24 [IU]/L (ref 0–44)
AST: 24 [IU]/L (ref 0–40)
Albumin: 4.3 g/dL (ref 3.8–4.9)
Alkaline Phosphatase: 71 [IU]/L (ref 44–121)
Bilirubin Total: 0.8 mg/dL (ref 0.0–1.2)
Bilirubin, Direct: 0.2 mg/dL (ref 0.00–0.40)
Total Protein: 6.4 g/dL (ref 6.0–8.5)

## 2022-12-10 LAB — LIPID PANEL
Chol/HDL Ratio: 4.6 {ratio} (ref 0.0–5.0)
Cholesterol, Total: 156 mg/dL (ref 100–199)
HDL: 34 mg/dL — ABNORMAL LOW (ref >39)
LDL Chol Calc (NIH): 103 mg/dL — ABNORMAL HIGH (ref 0–99)
Triglycerides: 103 mg/dL (ref 0–149)
VLDL Cholesterol Cal: 19 mg/dL (ref 5–40)

## 2022-12-10 MED ORDER — ROSUVASTATIN CALCIUM 10 MG PO TABS: 10.0000 mg | ORAL_TABLET | ORAL | 3 refills | Status: DC

## 2022-12-10 MED ORDER — ROSUVASTATIN CALCIUM 20 MG PO TABS: 20.0000 mg | ORAL_TABLET | ORAL | 3 refills | Status: DC

## 2022-12-10 NOTE — Telephone Encounter (Signed)
-----   Message from Ronney Asters sent at 12/10/2022  6:55 AM EDT ----- Please contact Justin Stanley and let him know that his lab work from yesterday has been reviewed.  His HDL cholesterol was slightly low at 34.  His LDL cholesterol continues to be above goal at 103.  We will change his rosuvastatin to 20 mg 3 times per week and 10 mg every other day.  Will plan to repeat his fasting lipids and LFTs in 2-3 months.  Please ask him to continue his physical activity and increase the fiber in his diet.

## 2022-12-10 NOTE — Telephone Encounter (Signed)
Pharmacy made aware of clarification on med orders.

## 2022-12-10 NOTE — Telephone Encounter (Signed)
Pt c/o medication issue:  1. Name of Medication:   rosuvastatin (CRESTOR) 10 MG tablet   rosuvastatin (CRESTOR) 20 MG tablet   2. How are you currently taking this medication (dosage and times per day)?    3. Are you having a reaction (difficulty breathing--STAT)? no  4. What is your medication issue? Need clarification on how the medication is suppose to be giving. Please advise

## 2022-12-10 NOTE — Telephone Encounter (Signed)
Called patient. Patient reviewed his results online on MyChart. He was made aware of medication changes and labs to be scheduled within 2-3 months. He verbalized understanding.

## 2023-02-27 LAB — LIPID PANEL
Chol/HDL Ratio: 4.4 {ratio} (ref 0.0–5.0)
Cholesterol, Total: 173 mg/dL (ref 100–199)
HDL: 39 mg/dL — ABNORMAL LOW (ref >39)
LDL Chol Calc (NIH): 112 mg/dL — ABNORMAL HIGH (ref 0–99)
Triglycerides: 119 mg/dL (ref 0–149)
VLDL Cholesterol Cal: 22 mg/dL (ref 5–40)

## 2023-02-27 LAB — HEPATIC FUNCTION PANEL
ALT: 31 [IU]/L (ref 0–44)
AST: 26 [IU]/L (ref 0–40)
Albumin: 4.7 g/dL (ref 3.8–4.9)
Alkaline Phosphatase: 78 [IU]/L (ref 44–121)
Bilirubin Total: 1 mg/dL (ref 0.0–1.2)
Bilirubin, Direct: 0.26 mg/dL (ref 0.00–0.40)
Total Protein: 7.2 g/dL (ref 6.0–8.5)

## 2023-07-10 ENCOUNTER — Encounter (HOSPITAL_BASED_OUTPATIENT_CLINIC_OR_DEPARTMENT_OTHER): Payer: Self-pay | Admitting: Family

## 2023-07-10 ENCOUNTER — Ambulatory Visit (HOSPITAL_BASED_OUTPATIENT_CLINIC_OR_DEPARTMENT_OTHER): Payer: BC Managed Care – PPO | Admitting: Family

## 2023-07-10 VITALS — BP 110/70 | HR 65 | Ht 72.0 in | Wt 169.6 lb

## 2023-07-10 DIAGNOSIS — Z8249 Family history of ischemic heart disease and other diseases of the circulatory system: Secondary | ICD-10-CM

## 2023-07-10 DIAGNOSIS — E785 Hyperlipidemia, unspecified: Secondary | ICD-10-CM

## 2023-07-10 MED ORDER — ROSUVASTATIN CALCIUM 20 MG PO TABS
20.0000 mg | ORAL_TABLET | Freq: Every day | ORAL | 3 refills | Status: AC
Start: 2023-07-10 — End: 2023-10-08

## 2023-07-10 NOTE — Patient Instructions (Signed)
 Medication Instructions:  CHANGE Rosuvastatin  to 20mg  daily *If you need a refill on your cardiac medications before your next appointment, please call your pharmacy*  Lab Work: Your physician recommends that you return for lab work in 6-8 weeks NMR panel and liver enzymes. Please fast for labs.  If you have labs (blood work) drawn today and your tests are completely normal, you will receive your results only by: MyChart Message (if you have MyChart) OR A paper copy in the mail If you have any lab test that is abnormal or we need to change your treatment, we will call you to review the results.  Testing/Procedures: Your provider has recommended a coronary calcium  score.   Follow-Up: At [facility], you and your health needs are our priority.  As part of our continuing mission to provide you with exceptional heart care, our providers are all part of one team.  This team includes your primary Cardiologist (physician) and Advanced Practice Providers or APPs (Physician Assistants and Nurse Practitioners) who all work together to provide you with the care you need, when you need it.  Your next appointment:   As needed  We recommend signing up for the patient portal called "MyChart".  Sign up information is provided on this After Visit Summary.  MyChart is used to connect with patients for Virtual Visits (Telemedicine).  Patients are able to view lab/test results, encounter notes, upcoming appointments, etc.  Non-urgent messages can be sent to your provider as well.   To learn more about what you can do with MyChart, go to [email or web address].   Other Instructions  Heart Healthy Diet Recommendations: A low-salt diet is recommended. Meats should be grilled, baked, or boiled. Avoid fried foods. Focus on lean protein sources like fish or chicken with vegetables and fruits. The American Heart Association is a Chief Technology Officer!  American Heart Association Diet and Lifeystyle  Recommendations   Exercise recommendations: The American Heart Association recommends 150 minutes of moderate intensity exercise weekly. Try 30 minutes of moderate intensity exercise 4-5 times per week. This could include walking, jogging, or swimming.

## 2023-07-10 NOTE — Progress Notes (Addendum)
 Cardiology Office Note   Date:  07/10/2023  ID:  Justin Stanley, DOB 1965-04-29, MRN [id number] PCP: Maryln Sober, PA-C  [facility] Providers Cardiologist:  Sheryle Donning, MD     History of Present Illness Justin Stanley is a 58 y.o. male  with a hx of HLD, BPH last seen 05/22/21.  Family history notable for early CAD   Prior patient of Dr. Nicholette Barley and has since established Dr. Veryl Gottron.  Holter monitor August 2016 with 1300 PAC but no runs of sustained tachycardia.  Coronary CTA 01/2018 with coronary calcium  score of 0.    October 2024 LDL 103 and as such rosuvastatin  adjusted to 20 mg and 10 mg every other day.  Repeat lipids 02/26/2023 LDL 112.   Presents today for follow-up independently.  Feeling overall since last seen.  He is retired from [employer] and is now working for the [employer] part-time from home.  Remains very active.  Eats predominantly at home and focuses on eating whole foods diet including fish, vegetarian choices. Reports no shortness of breath nor dyspnea on exertion. Reports no chest pain, pressure, or tightness. No edema, orthopnea, PND. Reports no palpitations.  Understandably frustrated by not seeing improvement in lipids, we discussed familial hyperlipidemia and its impact on cholesterol.  No adverse effects on rosuvastatin .  Of note, planning to move to [place] in the fall due to his wife's job and to be closer to his children.   ROS: Please see the history of present illness.    All other systems reviewed and are negative.   Studies Reviewed      Cardiac Studies & Procedures   ______________________________________________________________________________________________          CT SCANS  CT CARDIAC SCORING (SELF PAY ONLY) 01/22/2018  Addendum 01/22/2018  5:05 PM ADDENDUM REPORT: 01/22/2018 17:02  CLINICAL DATA:  Risk stratification  EXAM: Coronary Calcium  Score  TECHNIQUE: The patient  was scanned on a [employer] scanner. Axial non-contrast 3 mm slices were carried out through the heart. The data set was analyzed on a dedicated work station and scored using the Agatson method.  FINDINGS: Non-cardiac: See separate report from [facility].  Ascending Aorta: Normal size.  No calcifications.  Pericardium: Normal, trivial calcifications.  Coronary arteries: Normal origin.  Right dominance.  IMPRESSION: Coronary calcium  score of 0. This was 0 percentile for age and sex matched control.   Electronically Signed By: Christoper Crafts On: 01/22/2018 17:02  Narrative EXAM: OVER-READ INTERPRETATION  CT CHEST  The following report is an over-read performed by radiologist Dr. Erica Hau of [facility] on 01/22/2018. This over-read does not include interpretation of cardiac or coronary anatomy or pathology. The coronary calcium  score interpretation by the cardiologist is attached.  COMPARISON:  None.  FINDINGS: Vascular: No incidental findings.  Mediastinum/Nodes: Visualized mediastinum and hilar regions show a few tiny precarinal lymph nodes. No enlarged lymph nodes identified.  Lungs/Pleura: Visualized lungs show no evidence of pulmonary edema, consolidation, pneumothorax, nodule or pleural fluid.  Upper Abdomen: No acute abnormality.  Musculoskeletal: No chest wall mass or suspicious bone lesions identified.  IMPRESSION: No incidental findings.  Electronically Signed: By: Erica Hau M.D. On: 01/22/2018 14:55   CT SCANS  CT CARDIAC SCORING (SELF PAY ONLY) 07/17/2014  Addendum 07/17/2014  3:35 PM ADDENDUM REPORT: 07/17/2014 15:33  CLINICAL DATA:  Risk stratification  EXAM: Coronary Calcium  Score  TECHNIQUE: The patient was scanned on a [employer] Sensation 16 slice scanner. Axial non-contrast 3  mm slices were carried out through the heart. The data set was analyzed on a dedicated work station and scored using the  Agatson method.  FINDINGS: Non-cardiac: See separate report from [facility].  Ascending Aorta:  Normal caliber.  Pericardium: Normal  Coronary arteries:  Normal origin.  IMPRESSION: Coronary calcium  score of 0. This was 0 percentile for age and sex matched control.  Christoper Crafts   Electronically Signed By: Christoper Crafts On: 07/17/2014 15:33  Narrative EXAM: OVER-READ INTERPRETATION  CT CHEST  The following report is an over-read performed by radiologist Dr. Babs Bolster [facility] on 07/17/2014. This over-read does not include interpretation of cardiac or coronary anatomy or pathology. The coronary calcium  score interpretation by the cardiologist is attached.  COMPARISON:  No priors.  FINDINGS: 3 mm pulmonary nodule in the lateral segment of the right middle lobe (image 36 of series 4). 4 mm right lower lobe pulmonary nodule (image 14 of series 4). Within the visualized portions of the thorax there is no acute consolidative airspace disease, no pleural effusions, no pneumothorax and no lymphadenopathy. Visualized portions of the upper abdomen are unremarkable. There are no aggressive appearing lytic or blastic lesions noted in the visualized portions of the skeleton.  IMPRESSION: 1. Small pulmonary nodules in the right lung, largest of which measures only 4 mm in the right lower lobe. If the patient is at high risk for bronchogenic carcinoma, follow-up chest CT at 1 year is recommended. If the patient is at low risk, no follow-up is needed. This recommendation follows the consensus statement: Guidelines for Management of Small Pulmonary Nodules Detected on CT Scans: A Statement from the Fleischner Society as published in Radiology 2005; 237:395-400.  Electronically Signed: By: Alexandria Angel M.D. On: 07/17/2014 11:28     ______________________________________________________________________________________________       Risk Assessment/Calculations           Physical Exam VS:  BP 110/70   Pulse 65   Ht 6' (1.829 m)   Wt 169 lb 9.6 oz (76.9 kg)   SpO2 97%   BMI 23.00 kg/m    Wt Readings from Last 3 Encounters:  07/10/23 169 lb 9.6 oz (76.9 kg)  07/08/22 164 lb 14.4 oz (74.8 kg)  05/22/21 171 lb 1.6 oz (77.6 kg)    GEN: Well nourished, well developed in no acute distress NECK: No JVD; No carotid bruits CARDIAC: RRR, no murmurs, rubs, gallops RESPIRATORY:  Clear to auscultation without rales, wheezing or rhonchi  ABDOMEN: Soft, non-tender, non-distended EXTREMITIES:  No edema; No deformity   ASSESSMENT AND PLAN Palpitations- No recurrent palpitations. No indication for AV nodal blocking therapy.    Hyperlipidemia / Elevated lipoprotein a- 02/25/18 Lp(a) elevated 176.7 (prior 01/2018 203.1). 02/20/23 LDL 112 not at goal <100.  Increase Crestor  to 20 mg daily. LFT and NMR lipoprofile in 6-8 weeks.  Recommend aiming for 150 minutes of moderate intensity activity per week and following a heart healthy diet.     Family history of CAD - 2019 calcium  score 0. Plan for primary prevention. Recommend aiming for 150 minutes of moderate intensity activity per week and following a heart healthy diet.  Lipid management, as above. Update calcium  score as 5 years from last study.       Dispo: follow up PRN with cardiology (plans to establish in [place], [place] after move in the fall)  Signed, Clearnce Curia, NP

## 2023-07-17 ENCOUNTER — Ambulatory Visit (HOSPITAL_BASED_OUTPATIENT_CLINIC_OR_DEPARTMENT_OTHER)
Admission: RE | Admit: 2023-07-17 | Discharge: 2023-07-17 | Disposition: A | Discharge status: Home / Self Care | Payer: Self-pay | Source: Ambulatory Visit | Attending: Family | Admitting: Family

## 2023-07-17 DIAGNOSIS — Z8249 Family history of ischemic heart disease and other diseases of the circulatory system: Secondary | ICD-10-CM | POA: Insufficient documentation

## 2023-07-20 ENCOUNTER — Ambulatory Visit (HOSPITAL_BASED_OUTPATIENT_CLINIC_OR_DEPARTMENT_OTHER): Payer: Self-pay | Admitting: Family
